# Patient Record
Sex: Male | Born: 1935 | Race: Black or African American | Hispanic: No | Smoking: Former smoker
Health system: Southern US, Community
[De-identification: ages and names within clinical notes are randomized; demographics above are authoritative.]

## PROBLEM LIST (undated history)

## (undated) DIAGNOSIS — K5792 Diverticulitis of intestine, part unspecified, without perforation or abscess without bleeding: Secondary | ICD-10-CM

## (undated) DIAGNOSIS — R011 Cardiac murmur, unspecified: Secondary | ICD-10-CM

## (undated) DIAGNOSIS — Z972 Presence of dental prosthetic device (complete) (partial): Secondary | ICD-10-CM

## (undated) DIAGNOSIS — I509 Heart failure, unspecified: Secondary | ICD-10-CM

## (undated) DIAGNOSIS — I10 Essential (primary) hypertension: Secondary | ICD-10-CM

## (undated) DIAGNOSIS — E7800 Pure hypercholesterolemia, unspecified: Secondary | ICD-10-CM

## (undated) DIAGNOSIS — K219 Gastro-esophageal reflux disease without esophagitis: Secondary | ICD-10-CM

## (undated) DIAGNOSIS — N200 Calculus of kidney: Secondary | ICD-10-CM

## (undated) DIAGNOSIS — E119 Type 2 diabetes mellitus without complications: Secondary | ICD-10-CM

## (undated) HISTORY — PX: HERNIA REPAIR: SHX51

## (undated) HISTORY — DX: Heart failure, unspecified: I50.9

## (undated) HISTORY — PX: KIDNEY STONE SURGERY: SHX686

## (undated) HISTORY — DX: Type 2 diabetes mellitus without complications: E11.9

## (undated) HISTORY — DX: Calculus of kidney: N20.0

## (undated) HISTORY — PX: FINGER AMPUTATION: SHX636

## (undated) HISTORY — DX: Gastro-esophageal reflux disease without esophagitis: K21.9

## (undated) HISTORY — DX: Essential (primary) hypertension: I10

## (undated) HISTORY — DX: Pure hypercholesterolemia, unspecified: E78.00

## (undated) HISTORY — PX: HEMORRHOID BANDING: SHX5850

## (undated) HISTORY — PX: ESOPHAGUS SURGERY: SHX626

---

## 2005-01-25 ENCOUNTER — Ambulatory Visit: Payer: Self-pay | Admitting: Family Medicine

## 2006-11-22 DIAGNOSIS — I639 Cerebral infarction, unspecified: Secondary | ICD-10-CM

## 2006-11-22 HISTORY — DX: Cerebral infarction, unspecified: I63.9

## 2009-12-17 ENCOUNTER — Inpatient Hospital Stay: Payer: Self-pay | Admitting: Internal Medicine

## 2011-03-02 ENCOUNTER — Ambulatory Visit: Payer: Self-pay | Admitting: Urology

## 2011-03-09 ENCOUNTER — Ambulatory Visit: Payer: Self-pay | Admitting: Urology

## 2011-03-11 ENCOUNTER — Ambulatory Visit: Payer: Self-pay | Admitting: Urology

## 2011-03-18 ENCOUNTER — Ambulatory Visit: Payer: Self-pay | Admitting: Urology

## 2016-01-17 DIAGNOSIS — K5790 Diverticulosis of intestine, part unspecified, without perforation or abscess without bleeding: Secondary | ICD-10-CM | POA: Insufficient documentation

## 2016-01-17 DIAGNOSIS — K922 Gastrointestinal hemorrhage, unspecified: Secondary | ICD-10-CM | POA: Insufficient documentation

## 2016-01-21 DIAGNOSIS — K8689 Other specified diseases of pancreas: Secondary | ICD-10-CM | POA: Insufficient documentation

## 2018-01-20 ENCOUNTER — Encounter: Payer: Self-pay | Admitting: Gastroenterology

## 2019-07-02 NOTE — Progress Notes (Signed)
[facility]  [address], [address] [place], [place] [address] Phone: [phone]  Fax: [phone]   Clinic Day:  07/04/2019  Referring physician: Gwynne Edinger, MD  Chief Complaint: Ryan Serrano is a 85 y.o. male with anemia who is referred in consultation by Dr. Laurey Arrow for assessment and management.   HPI: The patient was diagnosed with hemolytic anemia by his PCP about 9 months ago. He has a history of diabetes, hyperlipidemia, and hypertension.   He notes that about 3 years ago, he was told he was anemic after having developing chills. He was admitted to [facility].  Colonoscopy showed diverticulitis with a bleed that was cauterized (no records available).  He denies receiving a blood transfusion. He was treated with oral iron for 1 year with improvement.  He subsequently had an EGD and colonoscopy in [place].  Per patient, there were polyps and no evidence of ulcer or gastritis. He has not undergone a capsule study.   Hemoglobin has been followed: 13.8 on 01/22/2011, 12.4 on 10/29/2011, 13.8 on 09/11/2012, 11.7 on 01/17/2016, 10.7 on 01/18/2016, 10.1 on 01/21/2016, 13.9 on 01/16/2017, 12.6 on 12/12/2017, 12.5 on 10/18/2018, 11.2 on 10/30/2018.  Guaiac cards were negative on 01/06/2018.  Urinalysis was negative for blood on 12/12/2017.   Available CBCs: 10/30/2018: hematocrit 34.9, hemoglobin 11.2, MCV 100, platelets 181,000, WBC 7,600 (ANC 5,100).  B12 was 591.  MMA was 17.8 (0.3-2.8).  LDH was 154.  Coombs was negative. 01/25/2019: hematocrit 36.5, hemoglobin 11.5, MCV 88, platelets 137,000, WBC 7,000 (ANC 4,600).  B12 was 636.   Symptomatically, he feels about the same as he did in 01/2019. He denies any fevers, sweats, or weight loss. He denies any headaches or visual changes. He denies any cough, shortness of breath or chest pain. He denies any abdominal pain, black stools, bloody stools, blood in his urine, or dysuria. He notes diabetic neuropathy. He  has chronic joint pain, for which he takes Tylenol.  He has occasional cramps. He notes dark spots on his arms.   His diet includes a lot of fried food. He eats meat about every day. He eats green, leafy vegetables. He denies any ice pica. He denies any restless legs.   He is currently on oral B-12 supplements. He denies any new medications or herbal products. He has a family history of prostate cancer in his father. He denies any family history of anemia. He is a poor historian and has limited knowledge of his family history.    Past Medical History:  Diagnosis Date  . Diabetes mellitus without complication ([place])   . GERD (gastroesophageal reflux disease)   . High cholesterol   . Hypertension   . Kidney stones     Past Surgical History:  Procedure Laterality Date  . ESOPHAGUS SURGERY    . FINGER AMPUTATION Left   . HEMORRHOID BANDING    . HERNIA REPAIR    . KIDNEY STONE SURGERY      Family History  Problem Relation Age of Onset  . Hypertension Mother   . Cancer Father   . Diabetes Father   . Hypertension Father     Social History:  reports that he quit smoking about 40 years ago. His smoking use included cigarettes. He quit after 16.00 years of use. His smokeless tobacco use includes chew. He reports that he does not drink alcohol or use drugs. He denies any tobacco or alcohol use. He denies any known exposure to radiation or toxins. He  currently works farming soybeans, corn, and wheat. He lives in [place]. He has been married for 55 years and has 4 children. The patient is alone in the clinic today with his daughter Ryan Serrano over the phone [phone]).   Allergies:  Allergies  Allergen Reactions  . Hydrochlorothiazide     Other reaction(s): hyponatremia-stopped by cards  . Penicillins Anaphylaxis and Other (See Comments)  . Atorvastatin Other (See Comments)    Other reaction(s): myalgias 12-06   . Bee Venom Other (See Comments)  . Hydrocodone Other (See Comments)  .  Hydrocodone-Acetaminophen Other (See Comments)    Nausea/vomotting Nausea/vomotting     Current Medications: Current Outpatient Medications  Medication Sig Dispense Refill  . acetaminophen (ACETAMINOPHEN 8 HOUR) 650 MG CR tablet Take 1,300 mg by mouth 2 (two) times daily.    Marland Kitchen amLODipine (NORVASC) 5 MG tablet Take 5 mg by mouth daily.     Marland Kitchen aspirin EC 81 MG tablet Take 81 mg by mouth daily.     . diclofenac sodium (VOLTAREN) 1 % GEL Apply 2 g topically as needed.     . fluticasone (FLONASE) 50 MCG/ACT nasal spray 1 spray by Each Nare route daily.    Marland Kitchen gabapentin (NEURONTIN) 300 MG capsule 600 mg Three (3) times a day.    . lisinopril (ZESTRIL) 40 MG tablet Take 40 mg by mouth daily.     . metFORMIN (GLUCOPHAGE-XR) 500 MG 24 hr tablet Take 500 mg by mouth 2 (two) times daily.     . pantoprazole (PROTONIX) 20 MG tablet Take 20 mg by mouth daily.    . simvastatin (ZOCOR) 20 MG tablet Take 20 mg by mouth daily at 6 PM.     . tamsulosin (FLOMAX) 0.4 MG CAPS capsule Take 0.4 mg by mouth daily.     . vitamin B-12 (CYANOCOBALAMIN) 1000 MCG tablet Take 1,000 mcg by mouth daily.     No current facility-administered medications for this visit.     Review of Systems  Constitutional: Negative.  Negative for chills, diaphoresis, fever, malaise/fatigue and weight loss.  HENT: Negative.  Negative for congestion, hearing loss, sinus pain and sore throat.   Eyes: Negative.  Negative for blurred vision.  Respiratory: Negative.  Negative for cough, shortness of breath and wheezing.   Cardiovascular: Negative.  Negative for chest pain, palpitations, orthopnea, leg swelling and PND.  Gastrointestinal: Negative.  Negative for abdominal pain, blood in stool, constipation, diarrhea, melena, nausea and vomiting.  Genitourinary: Negative.  Negative for dysuria, frequency, hematuria and urgency.  Musculoskeletal: Positive for joint pain (chronic) and myalgias (occasionally). Negative for back pain.  Skin:  Negative for rash.       Dark spots on arms.  Neurological: Positive for sensory change (diabetic neuropathy). Negative for dizziness, tingling, weakness and headaches.  Endo/Heme/Allergies: Negative.  Does not bruise/bleed easily.  Psychiatric/Behavioral: Negative.  Negative for depression, memory loss and substance abuse. The patient is not nervous/anxious and does not have insomnia.   All other systems reviewed and are negative.  Performance status (ECOG): 0  Vitals Blood pressure 137/79, pulse 66, temperature 97.7 F (36.5 C), temperature source Oral, resp. rate 16, weight 159 lb 13.3 oz (72.5 kg), SpO2 96 %.   Physical Exam  Constitutional: He is oriented to person, place, and time. He appears well-developed and well-nourished. No distress.  HENT:  Head: Normocephalic and atraumatic.  Mouth/Throat: Oropharynx is clear and moist. No oropharyngeal exudate.  Wearing a black cap and mask.  Short gray hair.  Eyes: Pupils are equal, round, and reactive to light. Conjunctivae and EOM are normal. No scleral icterus.  Brown eyes.  Neck: Normal range of motion. Neck supple.  Cardiovascular: Normal rate, regular rhythm and normal heart sounds.  No murmur heard. Pulmonary/Chest: Effort normal and breath sounds normal. No respiratory distress. He has no wheezes.  Abdominal: Soft. Bowel sounds are normal. He exhibits no distension. There is no splenomegaly or hepatomegaly. There is no abdominal tenderness.  Musculoskeletal: Normal range of motion.        General: No edema.  Lymphadenopathy:    He has no cervical adenopathy.    He has no axillary adenopathy.       Right: No supraclavicular adenopathy present.       Left: No supraclavicular adenopathy present.  Neurological: He is alert and oriented to person, place, and time.  Skin: Skin is warm and dry. He is not diaphoretic. No erythema.  Psychiatric: He has a normal mood and affect. His behavior is normal. Judgment and thought content  normal.  Nursing note and vitals reviewed.   No visits with results within 3 Day(s) from this visit.  Latest known visit with results is:  No results found for any previous visit.    Assessment:  Ryan Serrano is a 85 y.o. male with a normocytic anemia.  Hemoglobin has ranged from 11.2 - 11.5.  He has a history of a diverticular bleed 3 years ago necessitating admission.  He had an EGD and colonoscopy in [place] about 2 years ago (no report available).  Per patient, there were polyps and no evidence of ulcer or gastritis. He has not undergone a capsule study.  Guaiac cards were negative on 01/06/2018.  Urinalysis was negative for blood on 12/12/2017.   [facility] labs from 09/26/2018 - 01/25/2019 revealed following normal studies:  B12, LDH, Coombs.  MMA was 17.8 (0.3 - 2.8).    He is on oral B-12.  Symptomatically, he denies any abdominal pain, melena, hematochezia, hematuria or dark urine.  Diet is good.  He denies any ice pica or restless legs.   Plan: 1. Labs today: CBC with diff, CMP, ferritin, iron studies, retic count, LDH, haptoglobin, Coombs, G6PD assay, B12, folate, TSH, free T4. 2. Normocytic anemia  Discuss history of lower GI bleed and subsequent iron deficiency.  Discuss concern for hemolytic anemia and evaluation.  Discuss work-up. 3. RTC in 1 week for MD assessment, review of work-up and discussion regarding direction of therapy.  I discussed the assessment and treatment plan with the patient.  The patient was provided an opportunity to ask questions and all were answered.  The patient agreed with the plan and demonstrated an understanding of the instructions.  The patient was advised to call back if the symptoms worsen or if the condition fails to improve as anticipated.  I provided 25 minutes of face-to-face time during this this encounter and > 50% was spent counseling as documented under my assessment and plan.     C. Mike Gip, MD, PhD    07/04/2019, 10:51  AM  I, Molly Dorshimer, am acting as Education administrator for [employer]. Mike Gip, MD, PhD.  I,  C. Mike Gip, MD, have reviewed the above documentation for accuracy and completeness, and I agree with the above.

## 2019-07-04 ENCOUNTER — Inpatient Hospital Stay: Payer: Medicare HMO | Attending: Hematology and Oncology | Admitting: Hematology and Oncology

## 2019-07-04 ENCOUNTER — Inpatient Hospital Stay: Payer: Medicare HMO

## 2019-07-04 ENCOUNTER — Encounter: Payer: Self-pay | Admitting: Hematology and Oncology

## 2019-07-04 ENCOUNTER — Other Ambulatory Visit: Payer: Self-pay

## 2019-07-04 VITALS — BP 137/79 | HR 66 | Temp 97.7°F | Resp 16 | Wt 159.8 lb

## 2019-07-04 DIAGNOSIS — I10 Essential (primary) hypertension: Secondary | ICD-10-CM | POA: Insufficient documentation

## 2019-07-04 DIAGNOSIS — K5790 Diverticulosis of intestine, part unspecified, without perforation or abscess without bleeding: Secondary | ICD-10-CM | POA: Diagnosis not present

## 2019-07-04 DIAGNOSIS — D649 Anemia, unspecified: Secondary | ICD-10-CM | POA: Diagnosis not present

## 2019-07-04 DIAGNOSIS — Z79899 Other long term (current) drug therapy: Secondary | ICD-10-CM | POA: Insufficient documentation

## 2019-07-04 DIAGNOSIS — E119 Type 2 diabetes mellitus without complications: Secondary | ICD-10-CM | POA: Insufficient documentation

## 2019-07-04 DIAGNOSIS — Z8042 Family history of malignant neoplasm of prostate: Secondary | ICD-10-CM | POA: Insufficient documentation

## 2019-07-04 DIAGNOSIS — K219 Gastro-esophageal reflux disease without esophagitis: Secondary | ICD-10-CM | POA: Diagnosis not present

## 2019-07-04 DIAGNOSIS — E7800 Pure hypercholesterolemia, unspecified: Secondary | ICD-10-CM | POA: Diagnosis not present

## 2019-07-04 LAB — CBC WITH DIFFERENTIAL/PLATELET
Abs Immature Granulocytes: 0.02 10*3/uL (ref 0.00–0.07)
Basophils Absolute: 0.1 10*3/uL (ref 0.0–0.1)
Basophils Relative: 1 %
Eosinophils Absolute: 0.1 10*3/uL (ref 0.0–0.5)
Eosinophils Relative: 1 %
HCT: 39.1 % (ref 39.0–52.0)
Hemoglobin: 12.2 g/dL — ABNORMAL LOW (ref 13.0–17.0)
Immature Granulocytes: 0 %
Lymphocytes Relative: 18 %
Lymphs Abs: 1.4 10*3/uL (ref 0.7–4.0)
MCH: 28 pg (ref 26.0–34.0)
MCHC: 31.2 g/dL (ref 30.0–36.0)
MCV: 89.7 fL (ref 80.0–100.0)
Monocytes Absolute: 0.5 10*3/uL (ref 0.1–1.0)
Monocytes Relative: 7 %
Neutro Abs: 5.4 10*3/uL (ref 1.7–7.7)
Neutrophils Relative %: 73 %
Platelets: 137 10*3/uL — ABNORMAL LOW (ref 150–400)
RBC: 4.36 MIL/uL (ref 4.22–5.81)
RDW: 15.6 % — ABNORMAL HIGH (ref 11.5–15.5)
WBC: 7.4 10*3/uL (ref 4.0–10.5)
nRBC: 0 % (ref 0.0–0.2)

## 2019-07-04 LAB — COMPREHENSIVE METABOLIC PANEL
ALT: 12 U/L (ref 0–44)
AST: 15 U/L (ref 15–41)
Albumin: 3.9 g/dL (ref 3.5–5.0)
Alkaline Phosphatase: 64 U/L (ref 38–126)
Anion gap: 9 (ref 5–15)
BUN: 12 mg/dL (ref 8–23)
CO2: 26 mmol/L (ref 22–32)
Calcium: 9.1 mg/dL (ref 8.9–10.3)
Chloride: 105 mmol/L (ref 98–111)
Creatinine, Ser: 1.04 mg/dL (ref 0.61–1.24)
GFR calc Af Amer: >60 mL/min (ref >60)
GFR calc non Af Amer: >60 mL/min (ref >60)
Glucose, Bld: 190 mg/dL — ABNORMAL HIGH (ref 70–99)
Potassium: 4.3 mmol/L (ref 3.5–5.1)
Sodium: 140 mmol/L (ref 135–145)
Total Bilirubin: 0.6 mg/dL (ref 0.3–1.2)
Total Protein: 7.3 g/dL (ref 6.5–8.1)

## 2019-07-04 LAB — IRON AND TIBC
Iron: 60 ug/dL (ref 45–182)
Saturation Ratios: 15 % — ABNORMAL LOW (ref 17.9–39.5)
TIBC: 395 ug/dL (ref 250–450)
UIBC: 335 ug/dL

## 2019-07-04 LAB — FERRITIN: Ferritin: 12 ng/mL — ABNORMAL LOW (ref 24–336)

## 2019-07-04 LAB — FOLATE: Folate: 14.5 ng/mL (ref >5.9)

## 2019-07-04 LAB — LACTATE DEHYDROGENASE: LDH: 142 U/L (ref 98–192)

## 2019-07-04 LAB — RETICULOCYTES
Immature Retic Fract: 12 % (ref 2.3–15.9)
RBC.: 4.44 MIL/uL (ref 4.22–5.81)
Retic Count, Absolute: 52.8 10*3/uL (ref 19.0–186.0)
Retic Ct Pct: 1.2 % (ref 0.4–3.1)

## 2019-07-04 LAB — TSH: TSH: 0.88 u[IU]/mL (ref 0.350–4.500)

## 2019-07-04 NOTE — Progress Notes (Signed)
Pt here as new patient referral from Dr. Si Raider. Pt denies any concerns.

## 2019-07-05 LAB — HAPTOGLOBIN: Haptoglobin: 194 mg/dL (ref 38–329)

## 2019-07-06 LAB — MULTIPLE MYELOMA PANEL, SERUM
Albumin SerPl Elph-Mcnc: 3.8 g/dL (ref 2.9–4.4)
Albumin/Glob SerPl: 1.4 (ref 0.7–1.7)
Alpha 1: 0.2 g/dL (ref 0.0–0.4)
Alpha2 Glob SerPl Elph-Mcnc: 0.7 g/dL (ref 0.4–1.0)
B-Globulin SerPl Elph-Mcnc: 1.3 g/dL (ref 0.7–1.3)
Gamma Glob SerPl Elph-Mcnc: 0.7 g/dL (ref 0.4–1.8)
Globulin, Total: 2.9 g/dL (ref 2.2–3.9)
IgA: 527 mg/dL — ABNORMAL HIGH (ref 61–437)
IgG (Immunoglobin G), Serum: 878 mg/dL (ref 603–1613)
IgM (Immunoglobulin M), Srm: 44 mg/dL (ref 15–143)
M Protein SerPl Elph-Mcnc: 0.4 g/dL — ABNORMAL HIGH
Total Protein ELP: 6.7 g/dL (ref 6.0–8.5)

## 2019-07-11 ENCOUNTER — Inpatient Hospital Stay: Payer: Medicare HMO | Admitting: Oncology

## 2019-07-11 NOTE — Progress Notes (Deleted)
[facility]  [address], [address] [place],  [address] Phone: [phone]  Fax: [phone]   Clinic Day:  07/11/2019  Referring physician: Weber Cooks, MD  Chief Complaint: Ryan Serrano is a 85 y.o. male with anemia who is referred in consultation by Dr. Laurey Arrow for assessment and management.   HPI: The patient was diagnosed with hemolytic anemia by his PCP about 9 months ago. He has a history of diabetes, hyperlipidemia, and hypertension.   He notes that about 3 years ago, he was told he was anemic after having developing chills. He was admitted to [facility].  Colonoscopy showed diverticulitis with a bleed that was cauterized (no records available).  He denies receiving a blood transfusion. He was treated with oral iron for 1 year with improvement.  He subsequently had an EGD and colonoscopy in [place].  Per patient, there were polyps and no evidence of ulcer or gastritis. He has not undergone a capsule study.   Hemoglobin has been followed: 13.8 on 01/22/2011, 12.4 on 10/29/2011, 13.8 on 09/11/2012, 11.7 on 01/17/2016, 10.7 on 01/18/2016, 10.1 on 01/21/2016, 13.9 on 01/16/2017, 12.6 on 12/12/2017, 12.5 on 10/18/2018, 11.2 on 10/30/2018.  Guaiac cards were negative on 01/06/2018.  Urinalysis was negative for blood on 12/12/2017.   Available CBCs: 10/30/2018: hematocrit 34.9, hemoglobin 11.2, MCV 100, platelets 181,000, WBC 7,600 (ANC 5,100).  B12 was 591.  MMA was 17.8 (0.3-2.8).  LDH was 154.  Coombs was negative. 01/25/2019: hematocrit 36.5, hemoglobin 11.5, MCV 88, platelets 137,000, WBC 7,000 (ANC 4,600).  B12 was 636.   Symptomatically, he feels about the same as he did in 01/2019. He denies any fevers, sweats, or weight loss. He denies any headaches or visual changes. He denies any cough, shortness of breath or chest pain. He denies any abdominal pain, black stools, bloody stools, blood in his urine, or dysuria. He notes diabetic neuropathy. He has  chronic joint pain, for which he takes Tylenol.  He has occasional cramps. He notes dark spots on his arms.   His diet includes a lot of fried food. He eats meat about every day. He eats green, leafy vegetables. He denies any ice pica. He denies any restless legs.   He is currently on oral B-12 supplements. He denies any new medications or herbal products. He has a family history of prostate cancer in his father. He denies any family history of anemia. He is a poor historian and has limited knowledge of his family history.    Past Medical History:  Diagnosis Date  . Diabetes mellitus without complication ([place])   . GERD (gastroesophageal reflux disease)   . High cholesterol   . Hypertension   . Kidney stones     Past Surgical History:  Procedure Laterality Date  . ESOPHAGUS SURGERY    . FINGER AMPUTATION Left   . HEMORRHOID BANDING    . HERNIA REPAIR    . KIDNEY STONE SURGERY      Family History  Problem Relation Age of Onset  . Hypertension Mother   . Cancer Father   . Diabetes Father   . Hypertension Father     Social History:  reports that he quit smoking about 40 years ago. His smoking use included cigarettes. He quit after 16.00 years of use. His smokeless tobacco use includes chew. He reports that he does not drink alcohol or use drugs. He denies any tobacco or alcohol use. He denies any known exposure to radiation or toxins. 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 He has been married for 55 years and has 4 children. The patient is alone in the clinic today with his daughter [name] over the phone [phone]).   Allergies:  Allergies  Allergen Reactions  . Hydrochlorothiazide     Other reaction(s): hyponatremia-stopped by cards  . Penicillins Anaphylaxis and Other (See Comments)  . Atorvastatin Other (See Comments)    Other reaction(s): myalgias 12-06   . Bee Venom Other (See Comments)  . Hydrocodone Other (See Comments)  .  Hydrocodone-Acetaminophen Other (See Comments)    Nausea/vomotting Nausea/vomotting     Current Medications: Current Outpatient Medications  Medication Sig Dispense Refill  . acetaminophen (ACETAMINOPHEN 8 HOUR) 650 MG CR tablet Take 1,300 mg by mouth 2 (two) times daily.    Marland Kitchen amLODipine (NORVASC) 5 MG tablet Take 5 mg by mouth daily.     Marland Kitchen aspirin EC 81 MG tablet Take 81 mg by mouth daily.     . diclofenac sodium (VOLTAREN) 1 % GEL Apply 2 g topically as needed.     . fluticasone (FLONASE) 50 MCG/ACT nasal spray 1 spray by Each Nare route daily.    Marland Kitchen gabapentin (NEURONTIN) 300 MG capsule 600 mg Three (3) times a day.    . lisinopril (ZESTRIL) 40 MG tablet Take 40 mg by mouth daily.     . metFORMIN (GLUCOPHAGE-XR) 500 MG 24 hr tablet Take 500 mg by mouth 2 (two) times daily.     . pantoprazole (PROTONIX) 20 MG tablet Take 20 mg by mouth daily.    . simvastatin (ZOCOR) 20 MG tablet Take 20 mg by mouth daily at 6 PM.     . tamsulosin (FLOMAX) 0.4 MG CAPS capsule Take 0.4 mg by mouth daily.     . vitamin B-12 (CYANOCOBALAMIN) 1000 MCG tablet Take 1,000 mcg by mouth daily.     No current facility-administered medications for this visit.     Review of Systems  Constitutional: Negative.  Negative for chills, diaphoresis, fever, malaise/fatigue and weight loss.  HENT: Negative.  Negative for congestion, hearing loss, sinus pain and sore throat.   Eyes: Negative.  Negative for blurred vision.  Respiratory: Negative.  Negative for cough, shortness of breath and wheezing.   Cardiovascular: Negative.  Negative for chest pain, palpitations, orthopnea, leg swelling and PND.  Gastrointestinal: Negative.  Negative for abdominal pain, blood in stool, constipation, diarrhea, melena, nausea and vomiting.  Genitourinary: Negative.  Negative for dysuria, frequency, hematuria and urgency.  Musculoskeletal: Positive for joint pain (chronic) and myalgias (occasionally). Negative for back pain.  Skin:  Negative for rash.       Dark spots on arms.  Neurological: Positive for sensory change (diabetic neuropathy). Negative for dizziness, tingling, weakness and headaches.  Endo/Heme/Allergies: Negative.  Does not bruise/bleed easily.  Psychiatric/Behavioral: Negative.  Negative for depression, memory loss and substance abuse. The patient is not nervous/anxious and does not have insomnia.   All other systems reviewed and are negative.  Performance status (ECOG): 0  Vitals There were no vitals taken for this visit.   Physical Exam  Constitutional: He is oriented to person, place, and time. He appears well-developed and well-nourished. No distress.  HENT:  Head: Normocephalic and atraumatic.  Mouth/Throat: Oropharynx is clear and moist. No oropharyngeal exudate.  Wearing a black cap and mask.  Short gray hair.  Eyes: Pupils are equal, round, and reactive to light. Conjunctivae and EOM are normal. No scleral icterus.  [place]  eyes.  Neck: Normal range of motion. Neck supple.  Cardiovascular: Normal rate, regular rhythm and normal heart sounds.  No murmur heard. Pulmonary/Chest: Effort normal and breath sounds normal. No respiratory distress. He has no wheezes.  Abdominal: Soft. Bowel sounds are normal. He exhibits no distension. There is no splenomegaly or hepatomegaly. There is no abdominal tenderness.  Musculoskeletal: Normal range of motion.        General: No edema.  Lymphadenopathy:    He has no cervical adenopathy.    He has no axillary adenopathy.       Right: No supraclavicular adenopathy present.       Left: No supraclavicular adenopathy present.  Neurological: He is alert and oriented to person, place, and time.  Skin: Skin is warm and dry. He is not diaphoretic. No erythema.  Psychiatric: He has a normal mood and affect. His behavior is normal. Judgment and thought content normal.  Nursing note and vitals reviewed.   No visits with results within 3 Day(s) from this visit.   Latest known visit with results is:  Office Visit on 07/04/2019  Component Date Value Ref Range Status  . IgG (Immunoglobin G), Serum 07/04/2019 878  603 - 1,613 mg/dL Final  . IgA 07/04/2019 527* 61 - 437 mg/dL Final  . IgM (Immunoglobulin M), Srm 07/04/2019 44  15 - 143 mg/dL Final  . Total Protein ELP 07/04/2019 6.7  6.0 - 8.5 g/dL Corrected  . Albumin SerPl Elph-Mcnc 07/04/2019 3.8  2.9 - 4.4 g/dL Corrected  . Alpha 1 07/04/2019 0.2  0.0 - 0.4 g/dL Corrected  . Alpha2 Glob SerPl Elph-Mcnc 07/04/2019 0.7  0.4 - 1.0 g/dL Corrected  . B-Globulin SerPl Elph-Mcnc 07/04/2019 1.3  0.7 - 1.3 g/dL Corrected  . Gamma Glob SerPl Elph-Mcnc 07/04/2019 0.7  0.4 - 1.8 g/dL Corrected  . M Protein SerPl Elph-Mcnc 07/04/2019 0.4* Not Observed g/dL Corrected  . Globulin, Total 07/04/2019 2.9  2.2 - 3.9 g/dL Corrected  . Albumin/Glob SerPl 07/04/2019 1.4  0.7 - 1.7 Corrected  . 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   . Please Note 07/04/2019 Comment   Corrected   Comment: (NOTE) Protein electrophoresis scan will follow via computer, mail, or courier delivery. Performed At: [facility] [place], [place] [id number] Rush Farmer MD IN:[id number]   . TSH 07/04/2019 0.880  0.350 - 4.500 uIU/mL Final   Comment: Performed by a 3rd Generation assay with a functional sensitivity of <=0.01 uIU/mL. Performed at [facility], [address], [place], [place] [address]   . Folate 07/04/2019 14.5  >5.9 ng/mL Final   Performed at [facility], [address] [place], [place] [address]  . LDH 07/04/2019 142  98 - 192 U/L Final   Performed at [facility], [address], [place], [place] [address]  . Retic Ct Pct 07/04/2019 1.2  0.4 - 3.1 % Final  . RBC. 07/04/2019 4.44  4.22 - 5.81 MIL/uL Final  . Retic Count, Absolute 07/04/2019 52.8  19.0 - 186.0 K/uL  Final  . Immature Retic Fract 07/04/2019 12.0  2.3 - 15.9 % Final   Performed at [facility], [address] [place], [place] [address]  . Iron 07/04/2019 60  45 - 182 ug/dL Final  . TIBC 07/04/2019 395  250 - 450 ug/dL Final  . Saturation Ratios 07/04/2019 15* 17.9 - 39.5 % Final  .  UIBC 07/04/2019 335  ug/dL Final   Performed at [facility], [address] [place], [place] [address]  . Ferritin 07/04/2019 12* 24 - 336 ng/mL Final   Performed at [facility], [address] [place], [place] [address]  . Sodium 07/04/2019 140  135 - 145 mmol/L Final  . Potassium 07/04/2019 4.3  3.5 - 5.1 mmol/L Final  . Chloride 07/04/2019 105  98 - 111 mmol/L Final  . CO2 07/04/2019 26  22 - 32 mmol/L Final  . Glucose, Bld 07/04/2019 190* 70 - 99 mg/dL Final  . BUN 07/04/2019 12  8 - 23 mg/dL Final  . Creatinine, Ser 07/04/2019 1.04  0.61 - 1.24 mg/dL Final  . Calcium 07/04/2019 9.1  8.9 - 10.3 mg/dL Final  . Total Protein 07/04/2019 7.3  6.5 - 8.1 g/dL Final  . Albumin 07/04/2019 3.9  3.5 - 5.0 g/dL Final  . AST 07/04/2019 15  15 - 41 U/L Final  . ALT 07/04/2019 12  0 - 44 U/L Final  . Alkaline Phosphatase 07/04/2019 64  38 - 126 U/L Final  . Total Bilirubin 07/04/2019 0.6  0.3 - 1.2 mg/dL Final  . GFR calc non Af Amer 07/04/2019 >60  >60 mL/min Final  . GFR calc Af Amer 07/04/2019 >60  >60 mL/min Final  . Anion gap 07/04/2019 9  5 - 15 Final   Performed at [facility], [address], [place], [place] [address]  . WBC 07/04/2019 7.4  4.0 - 10.5 K/uL Final  . RBC 07/04/2019 4.36  4.22 - 5.81 MIL/uL Final  . Hemoglobin 07/04/2019 12.2* 13.0 - 17.0 g/dL Final  . HCT 07/04/2019 39.1  39.0 - 52.0 % Final  . MCV 07/04/2019 89.7  80.0 - 100.0 fL Final  . MCH 07/04/2019 28.0  26.0 - 34.0 pg Final  . MCHC 07/04/2019 31.2  30.0 - 36.0 g/dL Final  . RDW 07/04/2019 15.6* 11.5 - 15.5 % Final  . Platelets 07/04/2019 137* 150 - 400 K/uL Final  . nRBC  07/04/2019 0.0  0.0 - 0.2 % Final  . Neutrophils Relative % 07/04/2019 73  % Final  . Neutro Abs 07/04/2019 5.4  1.7 - 7.7 K/uL Final  . Lymphocytes Relative 07/04/2019 18  % Final  . Lymphs Abs 07/04/2019 1.4  0.7 - 4.0 K/uL Final  . Monocytes Relative 07/04/2019 7  % Final  . Monocytes Absolute 07/04/2019 0.5  0.1 - 1.0 K/uL Final  . Eosinophils Relative 07/04/2019 1  % Final  . Eosinophils Absolute 07/04/2019 0.1  0.0 - 0.5 K/uL Final  . Basophils Relative 07/04/2019 1  % Final  . Basophils Absolute 07/04/2019 0.1  0.0 - 0.1 K/uL Final  . Immature Granulocytes 07/04/2019 0  % Final  . Abs Immature Granulocytes 07/04/2019 0.02  0.00 - 0.07 K/uL Final   Performed at [facility], [address], [place], [place] [address]  . Haptoglobin 07/04/2019 194  38 - 329 mg/dL Final   Comment: (NOTE) Performed At: [facility] [place], [place] [id number] Rush Farmer MD PI:[id number]     Assessment:  Eivin Mascio is a 85 y.o. male with a normocytic anemia.  Hemoglobin has ranged from 11.2 - 11.5.  He has a history of a diverticular bleed 3 years ago necessitating admission.  He had an EGD and colonoscopy in [place] about 2 years ago (no report available).  Per patient, there were polyps and no evidence of ulcer  or gastritis. He has not undergone a capsule study.  Guaiac cards were negative on 01/06/2018.  Urinalysis was negative for blood on 12/12/2017.   [place] labs from 09/26/2018 - 01/25/2019 revealed following normal studies:  B12, LDH, Coombs.  MMA was 17.8 (0.3 - 2.8).    He is on oral B-12.  Symptomatically, he denies any abdominal pain, melena, hematochezia, hematuria or dark urine.  Diet is good.  He denies any ice pica or restless legs.   Plan: 1. Labs review 2. Normocytic anemia 3. Iron deficiency anemia: Ferritin 12, Saturation ratio 15%.  Hemoglobin 12.2. Discuss initiating iron supplementation orally versus parenterally  RTC in 1  week for MD assessment, review of work-up and discussion regarding direction of therapy.  I discussed the assessment and treatment plan with the patient.  The patient was provided an opportunity to ask questions and all were answered.  The patient agreed with the plan and demonstrated an understanding of the instructions.  The patient was advised to call back if the symptoms worsen or if the condition fails to improve as anticipated.  I provided 25 minutes of face-to-face time during this this encounter and > 50% was spent counseling as documented under my assessment and plan.    Faythe Casa, NP 07/11/2019 9:55 AM

## 2019-07-24 ENCOUNTER — Encounter: Payer: Self-pay | Admitting: Obstetrics and Gynecology

## 2019-07-24 NOTE — Progress Notes (Signed)
[facility]  [address] [place], [place] [address] Phone: [phone]  Fax: [phone]   Clinic Day:  07/25/2019  Referring physician: Weber Cooks, MD  Chief Complaint: Ryan Serrano is a 85 y.o. male with normocytic anemia who is seen for review of work-up and discussion regarding direction of therapy.   HPI: The patient was last seen in the hematology clinic on 07/04/2019 for initial consultation of a normocytic anemia.  He denied any abdominal pain, melena, hematochezia, hematuria or dark urine.  Diet was good. He denied any ice pica or restless legs.   Work-up on 07/04/2019: hematocrit 39.1, hemoglobin 12.2, MCV 89.7, platelets 137,000, WBC 7,400 with a normal differential.  Creatinine was 1.04.  LFTs were normal.  Ferritin was 12 with an iron saturation 15% and a TIBC of 395.  Haptoglobin was 194. Retic was 1.2%. LDH was 142.  Folate was 14.5, TSH 0.880. SPEP revealed a 0.4 gm/dL IgA monoclonal protein with kappa light chain specificity.   IgG was 878, IgA 527, and IgM 44.   During the interim, he has felt fair sometimes and good other times.  He denies any bleeding.     Past Medical History:  Diagnosis Date   Diabetes mellitus without complication (HCC)    GERD (gastroesophageal reflux disease)    High cholesterol    Hypertension    Kidney stones     Past Surgical History:  Procedure Laterality Date   ESOPHAGUS SURGERY     FINGER AMPUTATION Left    HEMORRHOID BANDING     HERNIA REPAIR     KIDNEY STONE SURGERY      Family History  Problem Relation Age of Onset   Hypertension Mother    Cancer Father    Diabetes Father    Hypertension Father     Social History:  reports that he quit smoking about 40 years ago. His smoking use included cigarettes. He quit after 16.00 years of use. His smokeless tobacco use includes chew. He reports that he does not drink alcohol or use drugs. He denies any tobacco or alcohol use. He  denies any known exposure to radiation or toxins. He currently works farming soybeans, corn, and wheat. He lives in [place]. He has been married for 55 years and has 4 children.  His daughter's name is [name] [phone]).  The patient is alone in the clinic today.  Allergies:  Allergies  Allergen Reactions   Hydrochlorothiazide     Other reaction(s): hyponatremia-stopped by cards   Penicillins Anaphylaxis and Other (See Comments)   Atorvastatin Other (See Comments)    Other reaction(s): myalgias 12-06    Bee Venom Other (See Comments)   Hydrocodone Other (See Comments)   Hydrocodone-Acetaminophen Other (See Comments)    Nausea/vomotting Nausea/vomotting     Current Medications: Current Outpatient Medications  Medication Sig Dispense Refill   acetaminophen (ACETAMINOPHEN 8 HOUR) 650 MG CR tablet Take 1,300 mg by mouth 2 (two) times daily.     amLODipine (NORVASC) 5 MG tablet Take 5 mg by mouth daily.      aspirin EC 81 MG tablet Take 81 mg by mouth daily.      diclofenac sodium (VOLTAREN) 1 % GEL Apply 2 g topically as needed.      fluticasone (FLONASE) 50 MCG/ACT nasal spray 1 spray by Each Nare route daily.     gabapentin (NEURONTIN) 300 MG capsule 600 mg Three (3) times a day.     lisinopril (ZESTRIL) 40 MG  tablet Take 40 mg by mouth daily.      metFORMIN (GLUCOPHAGE-XR) 500 MG 24 hr tablet Take 500 mg by mouth 2 (two) times daily.      pantoprazole (PROTONIX) 20 MG tablet Take 20 mg by mouth daily.     simvastatin (ZOCOR) 20 MG tablet Take 20 mg by mouth daily at 6 PM.      tamsulosin (FLOMAX) 0.4 MG CAPS capsule Take 0.4 mg by mouth daily.      vitamin B-12 (CYANOCOBALAMIN) 1000 MCG tablet Take 1,000 mcg by mouth daily.     No current facility-administered medications for this visit.     Review of Systems  Constitutional: Positive for weight loss (2 pounds). Negative for chills, diaphoresis, fever and malaise/fatigue.       Feels "fair some days and  good other days".  HENT: Negative.  Negative for congestion, ear pain, hearing loss, nosebleeds, sinus pain and sore throat.   Eyes: Negative.  Negative for blurred vision, double vision and pain.  Respiratory: Negative.  Negative for cough, sputum production, shortness of breath and wheezing.   Cardiovascular: Negative.  Negative for chest pain, palpitations, orthopnea, leg swelling and PND.  Gastrointestinal: Negative.  Negative for blood in stool, constipation, diarrhea, melena, nausea and vomiting.  Genitourinary: Negative.  Negative for dysuria, frequency, hematuria and urgency.  Musculoskeletal: Positive for joint pain (chronic). Negative for back pain and myalgias.  Skin: Negative.  Negative for itching and rash.  Neurological: Positive for sensory change (diabetic neuropathy). Negative for tingling, weakness and headaches.  Endo/Heme/Allergies: Negative.  Does not bruise/bleed easily.       Diabetes.  Psychiatric/Behavioral: Negative.  Negative for depression, memory loss and substance abuse. The patient is not nervous/anxious and does not have insomnia.   All other systems reviewed and are negative.  Performance status (ECOG):  0  Vitals Blood pressure 117/61, pulse 70, temperature 97.6 F (36.4 C), temperature source Tympanic, resp. rate 18, weight 157 lb 15.4 oz (71.6 kg), SpO2 98 %.   Physical Exam  Constitutional: He is oriented to person, place, and time. He appears well-developed and well-nourished. No distress.  HENT:  Head: Normocephalic and atraumatic.  Blue cap and mask.  Short gray hair.  Eyes: Conjunctivae and EOM are normal. No scleral icterus.  Brown eyes.  Musculoskeletal:     Comments: S/p left index finger partial amputation.  Neurological: He is alert and oriented to person, place, and time.  Skin: He is not diaphoretic. No pallor.  Psychiatric: He has a normal mood and affect. His behavior is normal. Judgment and thought content normal.  Nursing note and  vitals reviewed.   No visits with results within 3 Day(s) from this visit.  Latest known visit with results is:  Office Visit on 07/04/2019  Component Date Value Ref Range Status   IgG (Immunoglobin G), Serum 07/04/2019 878  603 - 1,613 mg/dL Final   IgA 07/04/2019 527* 61 - 437 mg/dL Final   IgM (Immunoglobulin M), Srm 07/04/2019 44  15 - 143 mg/dL Final   Total Protein ELP 07/04/2019 6.7  6.0 - 8.5 g/dL Corrected   Albumin SerPl Elph-Mcnc 07/04/2019 3.8  2.9 - 4.4 g/dL Corrected   Alpha 1 07/04/2019 0.2  0.0 - 0.4 g/dL Corrected   Alpha2 Glob SerPl Elph-Mcnc 07/04/2019 0.7  0.4 - 1.0 g/dL Corrected   B-Globulin SerPl Elph-Mcnc 07/04/2019 1.3  0.7 - 1.3 g/dL Corrected   Gamma Glob SerPl Elph-Mcnc 07/04/2019 0.7  0.4 - 1.8 g/dL  Corrected   M Protein SerPl Elph-Mcnc 07/04/2019 0.4* Not Observed g/dL Corrected   Globulin, Total 07/04/2019 2.9  2.2 - 3.9 g/dL Corrected   Albumin/Glob SerPl 07/04/2019 1.4  0.7 - 1.7 Corrected   IFE 1 07/04/2019 Comment   Corrected   Comment: (NOTE) Immunofixation shows IgA monoclonal protein with kappa light chain specificity.    Please Note 07/04/2019 Comment   Corrected   Comment: (NOTE) Protein electrophoresis scan will follow via computer, mail, or courier delivery. Performed At: [facility] [place], [place] [id number] Rush Farmer MD [id number]    TSH 07/04/2019 0.880  0.350 - 4.500 uIU/mL Final   Comment: Performed by a 3rd Generation assay with a functional sensitivity of <=0.01 uIU/mL. Performed at [facility], [address] [place], [place] [address]    Folate 07/04/2019 14.5  >5.9 ng/mL Final   Performed at [facility], [address] [place], [place] [address]   LDH 07/04/2019 142  98 - 192 U/L Final   Performed at [facility], [address] [place], [place] [address]   Retic Ct Pct 07/04/2019 1.2  0.4 - 3.1 % Final   RBC. 07/04/2019 4.44  4.22 -  5.81 MIL/uL Final   Retic Count, Absolute 07/04/2019 52.8  19.0 - 186.0 K/uL Final   Immature Retic Fract 07/04/2019 12.0  2.3 - 15.9 % Final   Performed at [facility], [address] [place], [place] [address]   Iron 07/04/2019 60  45 - 182 ug/dL Final   TIBC 07/04/2019 395  250 - 450 ug/dL Final   Saturation Ratios 07/04/2019 15* 17.9 - 39.5 % Final   UIBC 07/04/2019 335  ug/dL Final   Performed at [facility], [address] [place], [place] [address]   Ferritin 07/04/2019 12* 24 - 336 ng/mL Final   Performed at [facility], [address] [place], [place] [address]   Sodium 07/04/2019 140  135 - 145 mmol/L Final   Potassium 07/04/2019 4.3  3.5 - 5.1 mmol/L Final   Chloride 07/04/2019 105  98 - 111 mmol/L Final   CO2 07/04/2019 26  22 - 32 mmol/L Final   Glucose, Bld 07/04/2019 190* 70 - 99 mg/dL Final   BUN 07/04/2019 12  8 - 23 mg/dL Final   Creatinine, Ser 07/04/2019 1.04  0.61 - 1.24 mg/dL Final   Calcium 07/04/2019 9.1  8.9 - 10.3 mg/dL Final   Total Protein 07/04/2019 7.3  6.5 - 8.1 g/dL Final   Albumin 07/04/2019 3.9  3.5 - 5.0 g/dL Final   AST 07/04/2019 15  15 - 41 U/L Final   ALT 07/04/2019 12  0 - 44 U/L Final   Alkaline Phosphatase 07/04/2019 64  38 - 126 U/L Final   Total Bilirubin 07/04/2019 0.6  0.3 - 1.2 mg/dL Final   GFR calc non Af Amer 07/04/2019 >60  >60 mL/min Final   GFR calc Af Amer 07/04/2019 >60  >60 mL/min Final   Anion gap 07/04/2019 9  5 - 15 Final   Performed at [facility], [address], [place], [place] [address]   WBC 07/04/2019 7.4  4.0 - 10.5 K/uL Final   RBC 07/04/2019 4.36  4.22 - 5.81 MIL/uL Final   Hemoglobin 07/04/2019 12.2* 13.0 - 17.0 g/dL Final   HCT 07/04/2019 39.1  39.0 - 52.0 % Final   MCV 07/04/2019 89.7  80.0 - 100.0 fL Final   [facility] 07/04/2019  28.0  26.0 - 34.0 pg Final   MCHC 07/04/2019 31.2  30.0 - 36.0 g/dL Final   RDW 07/04/2019 15.6* 11.5 -  15.5 % Final   Platelets 07/04/2019 137* 150 - 400 K/uL Final   nRBC 07/04/2019 0.0  0.0 - 0.2 % Final   Neutrophils Relative % 07/04/2019 73  % Final   Neutro Abs 07/04/2019 5.4  1.7 - 7.7 K/uL Final   Lymphocytes Relative 07/04/2019 18  % Final   Lymphs Abs 07/04/2019 1.4  0.7 - 4.0 K/uL Final   Monocytes Relative 07/04/2019 7  % Final   Monocytes Absolute 07/04/2019 0.5  0.1 - 1.0 K/uL Final   Eosinophils Relative 07/04/2019 1  % Final   Eosinophils Absolute 07/04/2019 0.1  0.0 - 0.5 K/uL Final   Basophils Relative 07/04/2019 1  % Final   Basophils Absolute 07/04/2019 0.1  0.0 - 0.1 K/uL Final   Immature Granulocytes 07/04/2019 0  % Final   Abs Immature Granulocytes 07/04/2019 0.02  0.00 - 0.07 K/uL Final   Performed at [facility], [address], [place], [place] [address]   Haptoglobin 07/04/2019 194  38 - 329 mg/dL Final   Comment: (NOTE) Performed At: [facility] [place], [place] [id number] Rush Farmer MD [id number]     Assessment:  Ryan Serrano is a 84 y.o. male with iron deficiency and an IgA monoclonal gammopathy.  Hemoglobin has ranged from 11.2 - 11.5.  [facility] labs from 09/26/2018 - 01/25/2019 revealed following normal studies:  B12, LDH, [place].  MMA was 17.8 (0.3 - 2.8).    Work-up on 07/04/2019: hematocrit 39.1, hemoglobin 12.2, MCV 89.7, platelets 137,000, WBC 7,400 with a normal differential.  Normal studies included: creatinine (1.04), calcium (9.1), LFTs, haptoglobin, LDH, folate, and TSH.  Ferritin was 12 with an iron saturation 15% and a TIBC of 395.  SPEP revealed a 0.4 gm/dL IgA monoclonal protein with kappa light chain specificity.   IgG was 878, IgA 527 (61-437), and IgM 44.   He has a history of a diverticular bleed 3 years ago necessitating admission.  He had an EGD and colonoscopy in [place] about 2 years ago (no report available).  Per patient, there were polyps and no evidence of ulcer or  gastritis. He has not undergone a capsule study.  Guaiac cards were negative on 01/06/2018.  Urinalysis was negative for blood on 12/12/2017.   He is on oral B-12.  Symptomatically, he feels good today.  Exam reveals no adenopathy or hepatosplenomegaly.  Plan: 1.   Labs today:  FLCA, beta2-microglobulin. 2.   Collect 24 hour urine for UPEP and free light chains. 3.   Monoclonal gammopathy  Patient has a IgA monoclonal gammopathy.  Discuss differential diagnosis:  monoclonal gammopathy of unknown significance (MGUS) versus myeloma  Suspect MGUS.  Discuss CRAB criteria for myeloma:  hypercalcemia, renal dysfunction, anemia and bone lesions.  Discuss additional work-up. 4.   Iron deficiency anemia      Hemoglobin 12.2.  MCV 89.7.  Ferritin 12 with an iron saturation of 15%.  Patient has a history of lower GI bleed and subsequent iron deficiency.  Patient denies any current bleeding.  Discuss trial of oral iron with vitamin C.  Goal ferritin 100.  If ineffective, discuss IV iron.   Potential side effects reviewed.   Preauth Venofer if needed.   Information provided.  5.   RTC in 4 weeks for MD assessment, labs (CBC with diff, ferritin- day  before), review of additional testing, and +/- Venofer.  I discussed the assessment and treatment plan with the patient.  The patient was provided an opportunity to ask questions and all were answered.  The patient agreed with the plan and demonstrated an understanding of the instructions.  The patient was advised to call back if the symptoms worsen or if the condition fails to improve as anticipated.   Lequita Asal, MD, PhD    07/25/2019, 11:33 AM

## 2019-07-25 ENCOUNTER — Inpatient Hospital Stay: Payer: Medicare HMO | Attending: Hematology and Oncology | Admitting: Hematology and Oncology

## 2019-07-25 ENCOUNTER — Other Ambulatory Visit: Payer: Self-pay

## 2019-07-25 ENCOUNTER — Encounter: Payer: Self-pay | Admitting: Hematology and Oncology

## 2019-07-25 ENCOUNTER — Inpatient Hospital Stay: Payer: Medicare HMO

## 2019-07-25 VITALS — BP 117/61 | HR 70 | Temp 97.6°F | Resp 18 | Wt 158.0 lb

## 2019-07-25 DIAGNOSIS — I10 Essential (primary) hypertension: Secondary | ICD-10-CM | POA: Diagnosis not present

## 2019-07-25 DIAGNOSIS — C9000 Multiple myeloma not having achieved remission: Secondary | ICD-10-CM | POA: Diagnosis present

## 2019-07-25 DIAGNOSIS — D509 Iron deficiency anemia, unspecified: Secondary | ICD-10-CM | POA: Diagnosis not present

## 2019-07-25 DIAGNOSIS — Z79899 Other long term (current) drug therapy: Secondary | ICD-10-CM | POA: Diagnosis not present

## 2019-07-25 DIAGNOSIS — Z791 Long term (current) use of non-steroidal anti-inflammatories (NSAID): Secondary | ICD-10-CM | POA: Insufficient documentation

## 2019-07-25 DIAGNOSIS — E119 Type 2 diabetes mellitus without complications: Secondary | ICD-10-CM | POA: Insufficient documentation

## 2019-07-25 DIAGNOSIS — Z7982 Long term (current) use of aspirin: Secondary | ICD-10-CM | POA: Insufficient documentation

## 2019-07-25 DIAGNOSIS — D472 Monoclonal gammopathy: Secondary | ICD-10-CM | POA: Diagnosis not present

## 2019-07-25 DIAGNOSIS — E7800 Pure hypercholesterolemia, unspecified: Secondary | ICD-10-CM | POA: Insufficient documentation

## 2019-07-25 DIAGNOSIS — F17210 Nicotine dependence, cigarettes, uncomplicated: Secondary | ICD-10-CM | POA: Diagnosis not present

## 2019-07-25 DIAGNOSIS — Z7984 Long term (current) use of oral hypoglycemic drugs: Secondary | ICD-10-CM | POA: Insufficient documentation

## 2019-07-25 NOTE — Progress Notes (Signed)
Pt here for follow up. Denies any concerns.  

## 2019-07-25 NOTE — Patient Instructions (Addendum)
Patient will try oral iron with orange juice or vitamin C.  If ineffective, will pursue IV iron.   Iron Sucrose injection What is this medicine? IRON SUCROSE (AHY ern SOO krohs) is an iron complex. Iron is used to make healthy red blood cells, which carry oxygen and nutrients throughout the body. This medicine is used to treat iron deficiency anemia in people with chronic kidney disease. This medicine may be used for other purposes; ask your health care provider or pharmacist if you have questions. COMMON BRAND NAME(S): Venofer What should I tell my health care provider before I take this medicine? They need to know if you have any of these conditions:  anemia not caused by low iron levels  heart disease  high levels of iron in the blood  kidney disease  liver disease  an unusual or allergic reaction to iron, other medicines, foods, dyes, or preservatives  pregnant or trying to get pregnant  breast-feeding How should I use this medicine? This medicine is for infusion into a vein. It is given by a health care professional in a hospital or clinic setting. Talk to your pediatrician regarding the use of this medicine in children. While this drug may be prescribed for children as young as 2 years for selected conditions, precautions do apply. Overdosage: If you think you have taken too much of this medicine contact a poison control center or emergency room at once. NOTE: This medicine is only for you. Do not share this medicine with others. What if I miss a dose? It is important not to miss your dose. Call your doctor or health care professional if you are unable to keep an appointment. What may interact with this medicine? Do not take this medicine with any of the following medications:  deferoxamine  dimercaprol  other iron products This medicine may also interact with the following medications:  chloramphenicol  deferasirox This list may not describe all possible  interactions. Give your health care provider a list of all the medicines, herbs, non-prescription drugs, or dietary supplements you use. Also tell them if you smoke, drink alcohol, or use illegal drugs. Some items may interact with your medicine. What should I watch for while using this medicine? Visit your doctor or healthcare professional regularly. Tell your doctor or healthcare professional if your symptoms do not start to get better or if they get worse. You may need blood work done while you are taking this medicine. You may need to follow a special diet. Talk to your doctor. Foods that contain iron include: whole grains/cereals, dried fruits, beans, or peas, leafy green vegetables, and organ meats (liver, kidney). What side effects may I notice from receiving this medicine? Side effects that you should report to your doctor or health care professional as soon as possible:  allergic reactions like skin rash, itching or hives, swelling of the face, lips, or tongue  breathing problems  changes in blood pressure  cough  fast, irregular heartbeat  feeling faint or lightheaded, falls  fever or chills  flushing, sweating, or hot feelings  joint or muscle aches/pains  seizures  swelling of the ankles or feet  unusually weak or tired Side effects that usually do not require medical attention (report to your doctor or health care professional if they continue or are bothersome):  diarrhea  feeling achy  headache  irritation at site where injected  nausea, vomiting  stomach upset  tiredness This list may not describe all possible side effects. Call your  doctor for medical advice about side effects. You may report side effects to FDA at 1-800-FDA-1088. Where should I keep my medicine? This drug is given in a hospital or clinic and will not be stored at home. NOTE: This sheet is a summary. It may not cover all possible information. If you have questions about this medicine,  talk to your doctor, pharmacist, or health care provider.  2020 Elsevier/Gold Standard (2011-08-19 17:14:35)   Iron Deficiency Anemia, Adult Iron-deficiency anemia is when you have a low amount of red blood cells or hemoglobin. This happens because you have too little iron in your body. Hemoglobin carries oxygen to parts of the body. Anemia can cause your body to not get enough oxygen. It may or may not cause symptoms. Follow these instructions at home: Medicines  Take over-the-counter and prescription medicines only as told by your doctor. This includes iron pills (supplements) and vitamins.  If you cannot handle taking iron pills by mouth, ask your doctor about getting iron through: ? A vein (intravenously). ? A shot (injection) into a muscle.  Take iron pills when your stomach is empty. If you cannot handle this, take them with food.  Do not drink milk or take antacids at the same time as your iron pills.  To prevent trouble pooping (constipation), eat fiber or take medicine (stool softener) as told by your doctor. Eating and drinking   Talk with your doctor before changing the foods you eat. He or she may tell you to eat foods that have a lot of iron, such as: ? Liver. ? Lowfat (lean) beef. ? Breads and cereals that have iron added to them (fortified breads and cereals). ? Eggs. ? Dried fruit. ? Dark green, leafy vegetables.  Drink enough fluid to keep your pee (urine) clear or pale yellow.  Eat fresh fruits and vegetables that are high in vitamin C. They help your body to use iron. Foods with a lot of vitamin C include: ? Oranges. ? Peppers. ? Tomatoes. ? Mangoes. General instructions  Return to your normal activities as told by your doctor. Ask your doctor what activities are safe for you.  Keep yourself clean, and keep things clean around you (your surroundings). Anemia can make you get sick more easily.  Keep all follow-up visits as told by your doctor. This is  important. Contact a doctor if:  You feel sick to your stomach (nauseous).  You throw up (vomit).  You feel weak.  You are sweating for no clear reason.  You have trouble pooping, such as: ? Pooping (having a bowel movement) less than 3 times a week. ? Straining to poop. ? Having poop that is hard, dry, or larger than normal. ? Feeling full or bloated. ? Pain in the lower belly. ? Not feeling better after pooping. Get help right away if:  You pass out (faint). If this happens, do not drive yourself to the hospital. Call your local emergency services (911 in the [place]  You have chest pain.  You have shortness of breath that: ? Is very bad. ? Gets worse with physical activity.  You have a fast heartbeat.  You get light-headed when getting up from sitting or lying down. This information is not intended to replace advice given to you by your health care provider. Make sure you discuss any questions you have with your health care provider. Document Released: 12/11/2010 Document Revised: 10/21/2017 Document Reviewed: 07/28/2016 Elsevier Patient Education  2020 [employer].

## 2019-07-26 DIAGNOSIS — C9000 Multiple myeloma not having achieved remission: Secondary | ICD-10-CM | POA: Diagnosis not present

## 2019-07-26 LAB — KAPPA/LAMBDA LIGHT CHAINS
Kappa free light chain: 74 mg/L — ABNORMAL HIGH (ref 3.3–19.4)
Kappa, lambda light chain ratio: 2.92 — ABNORMAL HIGH (ref 0.26–1.65)
Lambda free light chains: 25.3 mg/L (ref 5.7–26.3)

## 2019-07-26 LAB — BETA 2 MICROGLOBULIN, SERUM: Beta-2 Microglobulin: 2 mg/L (ref 0.6–2.4)

## 2019-07-27 ENCOUNTER — Other Ambulatory Visit: Payer: Self-pay

## 2019-07-27 DIAGNOSIS — D472 Monoclonal gammopathy: Secondary | ICD-10-CM

## 2019-07-28 LAB — FREE K+L LT CHAINS,QN,UR
Free Kappa Lt Chains,Ur: 10.06 mg/L (ref 0.63–113.79)
Free Kappa/Lambda Ratio: >15.72 (ref 1.03–31.76)
Free Lambda Lt Chains,Ur: <0.64 mg/L (ref 0.47–11.77)
Total Volume: 1700

## 2019-07-31 LAB — UPEP/TP, 24-HR URINE
Albumin, U: 100 %
Alpha 1, Urine: 0 %
Alpha 2, Urine: 0 %
Beta, Urine: 0 %
Gamma Globulin, Urine: 0 %
Total Protein, Urine-Ur/day: 78 mg/24 hr (ref 30–150)
Total Protein, Urine: 4.6 mg/dL
Total Volume: 1700

## 2019-08-21 ENCOUNTER — Inpatient Hospital Stay: Payer: Medicare HMO

## 2019-08-21 ENCOUNTER — Other Ambulatory Visit: Payer: Self-pay

## 2019-08-21 ENCOUNTER — Encounter: Payer: Self-pay | Admitting: Hematology and Oncology

## 2019-08-21 DIAGNOSIS — D472 Monoclonal gammopathy: Secondary | ICD-10-CM

## 2019-08-21 DIAGNOSIS — D509 Iron deficiency anemia, unspecified: Secondary | ICD-10-CM

## 2019-08-21 DIAGNOSIS — C9000 Multiple myeloma not having achieved remission: Secondary | ICD-10-CM | POA: Diagnosis not present

## 2019-08-21 LAB — CBC WITH DIFFERENTIAL/PLATELET
Abs Immature Granulocytes: 0.04 10*3/uL (ref 0.00–0.07)
Basophils Absolute: 0.1 10*3/uL (ref 0.0–0.1)
Basophils Relative: 1 %
Eosinophils Absolute: 0.2 10*3/uL (ref 0.0–0.5)
Eosinophils Relative: 2 %
HCT: 40.6 % (ref 39.0–52.0)
Hemoglobin: 12.8 g/dL — ABNORMAL LOW (ref 13.0–17.0)
Immature Granulocytes: 1 %
Lymphocytes Relative: 23 %
Lymphs Abs: 1.9 10*3/uL (ref 0.7–4.0)
MCH: 28.5 pg (ref 26.0–34.0)
MCHC: 31.5 g/dL (ref 30.0–36.0)
MCV: 90.4 fL (ref 80.0–100.0)
Monocytes Absolute: 0.6 10*3/uL (ref 0.1–1.0)
Monocytes Relative: 8 %
Neutro Abs: 5.4 10*3/uL (ref 1.7–7.7)
Neutrophils Relative %: 65 %
Platelets: 134 10*3/uL — ABNORMAL LOW (ref 150–400)
RBC: 4.49 MIL/uL (ref 4.22–5.81)
RDW: 15.2 % (ref 11.5–15.5)
WBC: 8.2 10*3/uL (ref 4.0–10.5)
nRBC: 0 % (ref 0.0–0.2)

## 2019-08-21 LAB — FERRITIN: Ferritin: 20 ng/mL — ABNORMAL LOW (ref 24–336)

## 2019-08-21 NOTE — Progress Notes (Signed)
No new changes noted today. The patient Name and DOB has been verified by phone. 

## 2019-08-22 ENCOUNTER — Inpatient Hospital Stay (HOSPITAL_BASED_OUTPATIENT_CLINIC_OR_DEPARTMENT_OTHER): Payer: Medicare HMO | Admitting: Hematology and Oncology

## 2019-08-22 ENCOUNTER — Encounter: Payer: Self-pay | Admitting: Hematology and Oncology

## 2019-08-22 ENCOUNTER — Inpatient Hospital Stay: Payer: Medicare HMO

## 2019-08-22 VITALS — BP 152/79 | HR 72 | Temp 96.0°F | Resp 16 | Wt 160.9 lb

## 2019-08-22 DIAGNOSIS — C9000 Multiple myeloma not having achieved remission: Secondary | ICD-10-CM | POA: Diagnosis not present

## 2019-08-22 DIAGNOSIS — D472 Monoclonal gammopathy: Secondary | ICD-10-CM | POA: Diagnosis not present

## 2019-08-22 DIAGNOSIS — D509 Iron deficiency anemia, unspecified: Secondary | ICD-10-CM | POA: Diagnosis not present

## 2019-08-22 NOTE — Progress Notes (Signed)
[facility]  [address] [place], [place] [address] Phone: [phone]  Fax: [phone]   Clinic Day:  08/22/2019  Referring physician: Weber Cooks, MD  Chief Complaint: Ryan Serrano is a 85 y.o. male with a monoclonal gammopathy and iron deficiency anemia who is seen for 1 month assessment.  HPI: The patient was last seen in the hematology clinic on 07/25/2019. At that time, he felt good.  Exam revealed no adenopathy or hepatosplenomegaly.  CBC on 07/04/2019 included a hematocrit of 39.1, hemoglobin 12.2, MCV 89.7, platelets 137,000, white count 7400 with an [place] 5400.  SPEP revealed an M-spike of 0.4 gm/dL IgA monoclonal protein with kappa light chain specificity.  Labs on 07/25/2019 included kappa free light chains 74, lambda light chains 25.3 and ratio 2.92 (0.26 - 1.65).  Beta-2 microglobulin was 2.0 (normal).  24-hour UPEP revealed no monoclonal protein with kappa/lambda free light chain ratio of > 15.72 (1.03 - 31.76)  Labs on 08/21/2019 included a hematocrit 40.6, hemoglobin 12.8, MCV 90.4, platelets 134,000, and WBC 8200.  Ferritin was 20.   During the interim, he is still feeling good.  He denies any complaints. He still has some joint pain and diabetic neuropathy.  He is taking 2 oral iron tablets a day with no problems.  He was encouraged to take oral iron with vitamin C or orange juice.   Past Medical History:  Diagnosis Date  . Diabetes mellitus without complication ([place])   . GERD (gastroesophageal reflux disease)   . High cholesterol   . Hypertension   . Kidney stones     Past Surgical History:  Procedure Laterality Date  . ESOPHAGUS SURGERY    . FINGER AMPUTATION Left   . HEMORRHOID BANDING    . HERNIA REPAIR    . KIDNEY STONE SURGERY      Family History  Problem Relation Age of Onset  . Hypertension Mother   . Cancer Father   . Diabetes Father   . Hypertension Father     Social History:  reports that he quit  smoking about 40 years ago. His smoking use included cigarettes. He quit after 16.00 years of use. His smokeless tobacco use includes chew. He reports that he does not drink alcohol or use drugs. He denies any tobacco or alcohol use. He denies any known exposure to radiation or toxins. Hecurrently works farming soybeans, corn, and wheat. He lives in [place].He has been married for 55 years and has 4 children. His daughter's name is [name] [phone]).   His nephew died during the interim.  The patient is alone today.  Allergies:  Allergies  Allergen Reactions  . Hydrochlorothiazide     Other reaction(s): hyponatremia-stopped by cards  . Penicillins Anaphylaxis and Other (See Comments)  . Atorvastatin Other (See Comments)    Other reaction(s): myalgias 12-06   . Bee Venom Other (See Comments)  . Hydrocodone Other (See Comments)  . Hydrocodone-Acetaminophen Other (See Comments)    Nausea/vomotting Nausea/vomotting     Current Medications: Current Outpatient Medications  Medication Sig Dispense Refill  . acetaminophen (ACETAMINOPHEN 8 HOUR) 650 MG CR tablet Take 1,300 mg by mouth 2 (two) times daily.    Marland Kitchen amLODipine (NORVASC) 5 MG tablet Take 5 mg by mouth daily.     Marland Kitchen aspirin EC 81 MG tablet Take 81 mg by mouth daily.     Marland Kitchen gabapentin (NEURONTIN) 300 MG capsule 600 mg Three (3) times a day.    . lisinopril (ZESTRIL)  40 MG tablet Take 40 mg by mouth daily.     . metFORMIN (GLUCOPHAGE-XR) 500 MG 24 hr tablet Take 500 mg by mouth 2 (two) times daily.     . pantoprazole (PROTONIX) 20 MG tablet Take 20 mg by mouth daily.    . simvastatin (ZOCOR) 20 MG tablet Take 20 mg by mouth daily at 6 PM.     . tamsulosin (FLOMAX) 0.4 MG CAPS capsule Take 0.4 mg by mouth daily.     . vitamin B-12 (CYANOCOBALAMIN) 1000 MCG tablet Take 1,000 mcg by mouth daily.    . diclofenac sodium (VOLTAREN) 1 % GEL Apply 2 g topically as needed.     . fluticasone (FLONASE) 50 MCG/ACT nasal spray 1 spray by Each  Nare route daily.     No current facility-administered medications for this visit.     Review of Systems  Constitutional: Negative.  Negative for chills, diaphoresis, fever, malaise/fatigue and weight loss (up 3 pounds).       Feels "good".  HENT: Negative.  Negative for congestion, ear pain, hearing loss, nosebleeds, sinus pain and sore throat.   Eyes: Negative.  Negative for blurred vision, double vision and pain.  Respiratory: Negative.  Negative for cough, sputum production, shortness of breath and wheezing.   Cardiovascular: Negative.  Negative for chest pain, palpitations, orthopnea, leg swelling and PND.  Gastrointestinal: Negative.  Negative for blood in stool, constipation, diarrhea, melena, nausea and vomiting.  Genitourinary: Negative.  Negative for dysuria, frequency, hematuria and urgency.  Musculoskeletal: Positive for joint pain (chronic). Negative for back pain and myalgias.  Skin: Negative.  Negative for itching and rash.  Neurological: Positive for sensory change (diabetic neuropathy). Negative for tingling, focal weakness, weakness and headaches.  Endo/Heme/Allergies: Negative.  Does not bruise/bleed easily.       Diabetes.  Psychiatric/Behavioral: Negative.  Negative for depression, memory loss and substance abuse. The patient is not nervous/anxious and does not have insomnia.   All other systems reviewed and are negative.  Performance status (ECOG): 0  Vitals Blood pressure (!) 152/79, pulse 72, temperature (!) 96 F (35.6 C), temperature source Tympanic, resp. rate 16, weight 160 lb 15 oz (73 kg), SpO2 97 %.   Physical Exam  Constitutional: He is oriented to person, place, and time. He appears well-developed and well-nourished. No distress.  HENT:  Head: Normocephalic and atraumatic.  Black cap and mask.  Short gray hair.  Eyes: Pupils are equal, round, and reactive to light. Conjunctivae and EOM are normal. No scleral icterus.  Brown eyes.  Neck: Normal range  of motion. Neck supple. No JVD present.  Cardiovascular: Normal rate, regular rhythm and normal heart sounds. Exam reveals no gallop and no friction rub.  No murmur heard. Pulmonary/Chest: Effort normal and breath sounds normal. He has no wheezes. He has no rales.  Abdominal: Soft. Bowel sounds are normal. He exhibits no mass. There is no abdominal tenderness. There is no rebound.  Musculoskeletal: Normal range of motion.     Comments: S/p left index finger partial amputation.  Lymphadenopathy:    He has no cervical adenopathy.  Neurological: He is alert and oriented to person, place, and time.  Skin: Skin is warm and dry. No rash noted. He is not diaphoretic. No erythema. No pallor.  Psychiatric: He has a normal mood and affect. His behavior is normal. Judgment and thought content normal.  Nursing note and vitals reviewed.   Appointment on 08/21/2019  Component Date Value Ref Range Status  .  Ferritin 08/21/2019 20* 24 - 336 ng/mL Final   Performed at [facility], [address] [place], [place] [address]  . WBC 08/21/2019 8.2  4.0 - 10.5 K/uL Final  . RBC 08/21/2019 4.49  4.22 - 5.81 MIL/uL Final  . Hemoglobin 08/21/2019 12.8* 13.0 - 17.0 g/dL Final  . HCT 08/21/2019 40.6  39.0 - 52.0 % Final  . MCV 08/21/2019 90.4  80.0 - 100.0 fL Final  . MCH 08/21/2019 28.5  26.0 - 34.0 pg Final  . MCHC 08/21/2019 31.5  30.0 - 36.0 g/dL Final  . RDW 08/21/2019 15.2  11.5 - 15.5 % Final  . Platelets 08/21/2019 134* 150 - 400 K/uL Final  . nRBC 08/21/2019 0.0  0.0 - 0.2 % Final  . Neutrophils Relative % 08/21/2019 65  % Final  . Neutro Abs 08/21/2019 5.4  1.7 - 7.7 K/uL Final  . Lymphocytes Relative 08/21/2019 23  % Final  . Lymphs Abs 08/21/2019 1.9  0.7 - 4.0 K/uL Final  . Monocytes Relative 08/21/2019 8  % Final  . Monocytes Absolute 08/21/2019 0.6  0.1 - 1.0 K/uL Final  . Eosinophils Relative 08/21/2019 2  % Final  . Eosinophils Absolute 08/21/2019 0.2  0.0 - 0.5 K/uL Final  .  Basophils Relative 08/21/2019 1  % Final  . Basophils Absolute 08/21/2019 0.1  0.0 - 0.1 K/uL Final  . Immature Granulocytes 08/21/2019 1  % Final  . Abs Immature Granulocytes 08/21/2019 0.04  0.00 - 0.07 K/uL Final   Performed at [facility], [address], [place], [place] [address]    Assessment:  Ryan Serrano is a 85 y.o. male with iron deficiency and an IgA monoclonal gammopathy. Hemoglobinhas ranged from 11.2 - 11.5.  [facility] labs from 09/26/2018 - 03/05/2020revealed following normal studies: B12, LDH, Coombs. MMA was 17.8 (0.3 - 2.8).   Work-up on 07/04/2019: hematocrit 39.1, hemoglobin 12.2, MCV 89.7, platelets 137,000, WBC 7,400 with a normal differential.  Normal studies included: creatinine (1.04), calcium (9.1), LFTs, haptoglobin, LDH, folate, and TSH.  Ferritin was 12 with an iron saturation 15% and a TIBC of 395.  SPEP revealed a 0.4 gm/dL IgA monoclonal protein with kappa light chain specificity.   IgG was 878, IgA 527 (61-437), and IgM 44.  Kappa free light chains 74, lambda light chains 25.3 and ratio 2.92 (0.26 - 1.65) on 07/25/2019.  Beta-2 microglobulin was 2.0 (normal).  24-hour UPEP revealed no monoclonal protein with kappa/lambda free light chain ratio of > 15.72 (1.03 - 31.76).   He has a history of a diverticularbleed3 years ago necessitating admission. He had an EGD and colonoscopyin [place] about 2 years ago (no report available). Per patient,there were polyps andno evidence ofulcer orgastritis. He has not undergone a capsule study.Guaiac cardswere negative on 01/06/2018. Urinalysiswas negative for blood on 12/12/2017.   He has iron deficiency.  Ferritin was 12 on 07/04/2019 and 20 on 08/21/2019.  He is on oral B-12.  Symptomatically, he is doing well.  Exam is stable.  Plan: 1.   Review labs from 08/21/2019. 2.   Monoclonal gammopathy of unknown significance (MGUS)             Patient has an IgA monoclonal gammopathy.              M-spike is 0.4 gm/dL.  Review interval UPEP- no monoclonal protein.  Review CRAB criteria:  hypercalcemia, renal dysfunction, anemia and bone lesions.  Continue to monitor. 3.   Iron deficiency anemia           Hemoglobin 12.2.  MCV 89.7 on 07/04/2019.       Hemoglobin 12.8.  MCV 90.4 on 08/21/2019.   Ferritin was 12 on 07/04/2019 and 20 on 08/21/2019.       He has a history of lower GI bleed and subsequent iron deficiency.       He denies any bleeding.       Continue oral iron with vitamin C.    Ferritin goal is 100.   4.   RTC in 3 months for MD assess and labs (CBC with diff, CMP, SPEP, ferritin, iron studies).  I discussed the assessment and treatment plan with the patient.  The patient was provided an opportunity to ask questions and all were answered.  The patient agreed with the plan and demonstrated an understanding of the instructions.  The patient was advised to call back if the symptoms worsen or if the condition fails to improve as anticipated.   Lequita Asal, MD, PhD    08/22/2019, 10:34 AM  I, Samul Dada, am acting as a scribe for Lequita Asal, MD.  I, [employer] Mike Gip, MD, have reviewed the above documentation for accuracy and completeness, and I agree with the above.

## 2019-08-22 NOTE — Progress Notes (Signed)
Pt in for follow up, reports having cramping and aching in feet".

## 2019-11-19 NOTE — Progress Notes (Signed)
[facility]  [address] [place], [place] [address] Phone: [phone]  Fax: [phone]   Telephone Office Visit:  11/21/2019   Referring physician: Weber Cooks, MD  I connected with Yves Dill on 11/21/2019 at 11:45 AM  by telephone and verified that I was speaking with the correct person using 2 identifiers.  The patient was at a service station in [place].  I discussed the limitations, risk, security and privacy concerns of performing an evaluation and management service by telephone and the availability of in person appointments.  I also discussed with the patient that there may be a patient responsible charge related to this service.  The patient expressed understanding and agreed to proceed.   Chief Complaint: Ryan Serrano is a 85 y.o. male with a monoclonal gammopathy and iron deficiency anemia who is seen for 3 month assessment   HPI: The patient was last seen in the hematology clinic on 08/22/2019. At that time, he was doing well.  Exam was stable.  Hematocrit was 40.6 and hemoglobin 12.8  Ferritin was 20.  He continued oral iron.  During the interim, he has felt "fine".  He is still working. 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  He got a good report from his eye doctor, he saying he doesn't need glasses. His weight is good and notes he gained 5 lbs.  He puts a pain patch on his back at the beginning of the day. He says his neuropathy from his diabetes is doing alright. His BP is good. Last reading he remember was 121/78 with a pulse around 77.  He denies any bleeding.  He denies any interval infections.   Past Medical History:  Diagnosis Date  . Diabetes mellitus without complication ([place])   . GERD (gastroesophageal reflux disease)   . High cholesterol   . Hypertension   . Kidney stones     Past Surgical History:  Procedure Laterality Date  . ESOPHAGUS SURGERY    . FINGER AMPUTATION Left   . HEMORRHOID BANDING    . HERNIA  REPAIR    . KIDNEY STONE SURGERY      Family History  Problem Relation Age of Onset  . Hypertension Mother   . Cancer Father   . Diabetes Father   . Hypertension Father     Social History:  reports that he quit smoking about 41 years ago. His smoking use included cigarettes. He quit after 16.00 years of use. His smokeless tobacco use includes chew. He reports that he does not drink alcohol or use drugs. He denies any known exposure to radiation or toxins. Hecurrently works farming soybeans, corn, and wheat. He lives in [place].He was been married for 55 years and has 4 children.His wife died 2 years ago.  His daughter's name is [name] [phone]).He can be reached at [phone])  His nephew died during the interim.  The patient is alone today.  Participants in the patient's visit and their role in the encounter included the patient, Samul Dada, scribe, and [employer], CMA, today.  The intake visit was provided by Samul Dada, scribe & Vito Berger, CMA.   Allergies:  Allergies  Allergen Reactions  . Hydrochlorothiazide     Other reaction(s): hyponatremia-stopped by cards  . Penicillins Anaphylaxis and Other (See Comments)  . Atorvastatin Other (See Comments)    Other reaction(s): myalgias 12-06   . Bee Venom Other (See Comments)  . Hydrocodone Other (See Comments)  . Hydrocodone-Acetaminophen Other (See  Comments)    Nausea/vomotting Nausea/vomotting     Current Medications: Current Outpatient Medications  Medication Sig Dispense Refill  . acetaminophen (ACETAMINOPHEN 8 HOUR) 650 MG CR tablet Take 1,300 mg by mouth 2 (two) times daily.    Marland Kitchen amLODipine (NORVASC) 5 MG tablet Take 5 mg by mouth daily.     Marland Kitchen aspirin EC 81 MG tablet Take 81 mg by mouth daily.     . diclofenac sodium (VOLTAREN) 1 % GEL Apply 2 g topically as needed.     . fluticasone (FLONASE) 50 MCG/ACT nasal spray 1 spray by Each Nare route daily.    Marland Kitchen gabapentin (NEURONTIN) 300 MG capsule  600 mg Three (3) times a day.    . lisinopril (ZESTRIL) 40 MG tablet Take 40 mg by mouth daily.     . metFORMIN (GLUCOPHAGE-XR) 500 MG 24 hr tablet Take 500 mg by mouth 2 (two) times daily.     . pantoprazole (PROTONIX) 20 MG tablet Take 20 mg by mouth daily.    . simvastatin (ZOCOR) 20 MG tablet Take 20 mg by mouth daily at 6 PM.     . tamsulosin (FLOMAX) 0.4 MG CAPS capsule Take 0.4 mg by mouth daily.     . vitamin B-12 (CYANOCOBALAMIN) 1000 MCG tablet Take 1,000 mcg by mouth daily.     No current facility-administered medications for this visit.    Review of Systems  Constitutional: Negative.  Negative for chills, diaphoresis, fever, malaise/fatigue and weight loss (up 5 pounds).       Feels "good".  HENT: Negative.  Negative for congestion, ear pain, hearing loss, nosebleeds, sinus pain and sore throat.   Eyes: Negative.  Negative for blurred vision, double vision and pain.  Respiratory: Negative.  Negative for cough, sputum production, shortness of breath and wheezing.   Cardiovascular: Negative.  Negative for chest pain, palpitations, orthopnea, leg swelling and PND.  Gastrointestinal: Negative.  Negative for blood in stool, constipation, diarrhea, melena, nausea and vomiting.  Genitourinary: Negative.  Negative for dysuria, frequency, hematuria and urgency.  Musculoskeletal: Positive for back pain and joint pain (chronic). Negative for myalgias.  Skin: Negative.  Negative for itching and rash.  Neurological: Positive for sensory change (diabetic neuropathy). Negative for tingling, focal weakness, weakness and headaches.  Endo/Heme/Allergies: Negative.  Does not bruise/bleed easily.       Diabetes.  Psychiatric/Behavioral: Negative.  Negative for depression, memory loss and substance abuse. The patient is not nervous/anxious and does not have insomnia.   All other systems reviewed and are negative.  Performance status (ECOG): 0  Vitals There were no vitals taken for this visit.     Physical Exam  Constitutional: He is oriented to person, place, and time.  Neurological: He is alert and oriented to person, place, and time.  Psychiatric: He has a normal mood and affect. His behavior is normal. Judgment and thought content normal.  Nursing note reviewed.   No visits with results within 3 Day(s) from this visit.  Latest known visit with results is:  Appointment on 08/21/2019  Component Date Value Ref Range Status  . Ferritin 08/21/2019 20* 24 - 336 ng/mL Final   Performed at [facility], [address] [place], [place] [address]  . WBC 08/21/2019 8.2  4.0 - 10.5 K/uL Final  . RBC 08/21/2019 4.49  4.22 - 5.81 MIL/uL Final  . Hemoglobin 08/21/2019 12.8* 13.0 - 17.0 g/dL Final  . HCT 08/21/2019 40.6  39.0 - 52.0 % Final  .  MCV 08/21/2019 90.4  80.0 - 100.0 fL Final  . MCH 08/21/2019 28.5  26.0 - 34.0 pg Final  . MCHC 08/21/2019 31.5  30.0 - 36.0 g/dL Final  . RDW 08/21/2019 15.2  11.5 - 15.5 % Final  . Platelets 08/21/2019 134* 150 - 400 K/uL Final  . nRBC 08/21/2019 0.0  0.0 - 0.2 % Final  . Neutrophils Relative % 08/21/2019 65  % Final  . Neutro Abs 08/21/2019 5.4  1.7 - 7.7 K/uL Final  . Lymphocytes Relative 08/21/2019 23  % Final  . Lymphs Abs 08/21/2019 1.9  0.7 - 4.0 K/uL Final  . Monocytes Relative 08/21/2019 8  % Final  . Monocytes Absolute 08/21/2019 0.6  0.1 - 1.0 K/uL Final  . Eosinophils Relative 08/21/2019 2  % Final  . Eosinophils Absolute 08/21/2019 0.2  0.0 - 0.5 K/uL Final  . Basophils Relative 08/21/2019 1  % Final  . Basophils Absolute 08/21/2019 0.1  0.0 - 0.1 K/uL Final  . Immature Granulocytes 08/21/2019 1  % Final  . Abs Immature Granulocytes 08/21/2019 0.04  0.00 - 0.07 K/uL Final   Performed at [facility], [address] [place], [place] [address]    Assessment:  Martelle Heitzman is a 85 y.o. male withiron deficiencyand an IgA monoclonal gammopathy. Hemoglobinhas ranged from 11.2 - 11.5.  [facility]  labs from 09/26/2018 - 03/05/2020revealed following normal studies: B12, LDH, Coombs. MMA was 17.8 (0.3 - 2.8).  Work-up on 07/04/2019:hematocrit 39.1, hemoglobin 12.2, MCV 89.7, platelets 137,000, WBC 7,400 with a normal differential.Normal studiesincluded: creatinine (1.04), calcium (9.1), LFTs, haptoglobin, LDH, folate, and TSH. Ferritinwas 12 with an iron saturation 15%and a TIBC of 395.SPEPrevealed a 0.4gm/dL IgA monoclonal protein with kappa light chain specificity.IgG was878, IgA 527(61-437),andIgM 44.  Kappa free light chains 74, lambda light chains 25.3 and ratio 2.92 (0.26 - 1.65) on 07/25/2019.  Beta-2 microglobulin was 2.0 (normal).  24-hour UPEP revealed no monoclonal protein with kappa/lambda free light chain ratio of > 15.72 (1.03 - 31.76).  SPEP has been followed (gm/dL): 0.4 on 07/04/2019, 0.1 on 11/11/2019.  He has a history of a diverticularbleed3 years ago necessitating admission. He had an EGD and colonoscopyin [place] about 2 years ago (no report available). Per patient,there were polyps andno evidence ofulcer orgastritis. He has not undergone a capsule study.Guaiac cardswere negative on 01/06/2018. Urinalysiswas negative for blood on 12/12/2017.   He has iron deficiency.  Ferritin was 12 on 07/04/2019, 20 on 08/21/2019, and 26 on 11/21/2019.  He is on oral B12.  Symptomatically, he feels "good".  He denies any fevers or infections.  He denies any bruising or bleeding.  Plan: 1.   Labs today: CBC with diff, CMP, SPEP, ferritin, iron studies. 2.   Monoclonal gammopathy of unknown significance (MGUS) He has an IgA monoclonal gammopathy. M-spike is 0.1 gm/dL.             UPEP revealed no monoclonal protein.             Review CRAB criteria: hypercalcemia, renal dysfunction, anemia and bone lesions. Continue to monitor. 3. Iron deficiencyanemia Hemoglobin 12.2. MCV 89.7 on  07/04/2019.   Ferritin 12. Hemoglobin 12.8.  MCV 90.4 on 08/21/2019.    Ferritin 20.       Hemoglobin 13.8.  MCV 92.1 on 11/21/2019.   Ferritin 26. He has a history of lower GI bleed and subsequent iron deficiency. He denies any bleeding. Continue oral iron with vitamin C.  Ferritin goal is 100.   4.  RN to call patient with results (drawn after clinic). 5.   RTC in 3 months for labs (CBC, ferritin). 6.   RTC in 6 months for MD assessment and labs (CBC with diff, CMP, SPEP, ferritin, iron studies).  I discussed the assessment and treatment plan with the patient.  The patient was provided an opportunity to ask questions and all were answered.  The patient agreed with the plan and demonstrated an understanding of the instructions.  The patient was advised to call back if the symptoms worsen or if the condition fails to improve as anticipated.  I provided 16 minutes (11:45 AM - 12:01 PM) of non face-to-face time during this this encounter and > 50% was spent counseling as documented under my assessment and plan.    Lequita Asal, MD, PhD    11/21/2019, 12:01 PM  I, Samul Dada, am acting as a scribe for Lequita Asal, MD.  I, [place] Mike Gip, MD, have reviewed the above documentation for accuracy and completeness, and I agree with the above.

## 2019-11-20 ENCOUNTER — Other Ambulatory Visit: Payer: Self-pay

## 2019-11-20 ENCOUNTER — Encounter: Payer: Self-pay | Admitting: Hematology and Oncology

## 2019-11-21 ENCOUNTER — Encounter: Payer: Self-pay | Admitting: Hematology and Oncology

## 2019-11-21 ENCOUNTER — Inpatient Hospital Stay: Payer: Medicare HMO | Attending: Hematology and Oncology | Admitting: Hematology and Oncology

## 2019-11-21 ENCOUNTER — Inpatient Hospital Stay: Payer: Medicare HMO

## 2019-11-21 DIAGNOSIS — D509 Iron deficiency anemia, unspecified: Secondary | ICD-10-CM | POA: Diagnosis not present

## 2019-11-21 DIAGNOSIS — D472 Monoclonal gammopathy: Secondary | ICD-10-CM | POA: Diagnosis not present

## 2019-11-21 LAB — CBC WITH DIFFERENTIAL/PLATELET
Abs Immature Granulocytes: 0.02 10*3/uL (ref 0.00–0.07)
Basophils Absolute: 0.1 10*3/uL (ref 0.0–0.1)
Basophils Relative: 1 %
Eosinophils Absolute: 0.1 10*3/uL (ref 0.0–0.5)
Eosinophils Relative: 1 %
HCT: 42 % (ref 39.0–52.0)
Hemoglobin: 13.8 g/dL (ref 13.0–17.0)
Immature Granulocytes: 0 %
Lymphocytes Relative: 19 %
Lymphs Abs: 1.3 10*3/uL (ref 0.7–4.0)
MCH: 30.3 pg (ref 26.0–34.0)
MCHC: 32.9 g/dL (ref 30.0–36.0)
MCV: 92.1 fL (ref 80.0–100.0)
Monocytes Absolute: 0.5 10*3/uL (ref 0.1–1.0)
Monocytes Relative: 8 %
Neutro Abs: 4.9 10*3/uL (ref 1.7–7.7)
Neutrophils Relative %: 71 %
Platelets: 132 10*3/uL — ABNORMAL LOW (ref 150–400)
RBC: 4.56 MIL/uL (ref 4.22–5.81)
RDW: 13.4 % (ref 11.5–15.5)
WBC: 6.9 10*3/uL (ref 4.0–10.5)
nRBC: 0 % (ref 0.0–0.2)

## 2019-11-21 LAB — COMPREHENSIVE METABOLIC PANEL
ALT: 12 U/L (ref 0–44)
AST: 14 U/L — ABNORMAL LOW (ref 15–41)
Albumin: 4 g/dL (ref 3.5–5.0)
Alkaline Phosphatase: 52 U/L (ref 38–126)
Anion gap: 8 (ref 5–15)
BUN: 10 mg/dL (ref 8–23)
CO2: 25 mmol/L (ref 22–32)
Calcium: 9 mg/dL (ref 8.9–10.3)
Chloride: 104 mmol/L (ref 98–111)
Creatinine, Ser: 1.07 mg/dL (ref 0.61–1.24)
GFR calc Af Amer: >60 mL/min (ref >60)
GFR calc non Af Amer: >60 mL/min (ref >60)
Glucose, Bld: 126 mg/dL — ABNORMAL HIGH (ref 70–99)
Potassium: 3.7 mmol/L (ref 3.5–5.1)
Sodium: 137 mmol/L (ref 135–145)
Total Bilirubin: 0.7 mg/dL (ref 0.3–1.2)
Total Protein: 7.2 g/dL (ref 6.5–8.1)

## 2019-11-21 LAB — FERRITIN: Ferritin: 26 ng/mL (ref 24–336)

## 2019-11-21 LAB — IRON AND TIBC
Iron: 99 ug/dL (ref 45–182)
Saturation Ratios: 28 % (ref 17.9–39.5)
TIBC: 354 ug/dL (ref 250–450)
UIBC: 255 ug/dL

## 2019-11-22 LAB — PROTEIN ELECTROPHORESIS, SERUM
A/G Ratio: 1.3 (ref 0.7–1.7)
Albumin ELP: 3.7 g/dL (ref 2.9–4.4)
Alpha-1-Globulin: 0.2 g/dL (ref 0.0–0.4)
Alpha-2-Globulin: 0.7 g/dL (ref 0.4–1.0)
Beta Globulin: 1.2 g/dL (ref 0.7–1.3)
Gamma Globulin: 0.7 g/dL (ref 0.4–1.8)
Globulin, Total: 2.8 g/dL (ref 2.2–3.9)
M-Spike, %: 0.1 g/dL — ABNORMAL HIGH
Total Protein ELP: 6.5 g/dL (ref 6.0–8.5)

## 2020-02-19 ENCOUNTER — Inpatient Hospital Stay: Payer: Medicare HMO | Attending: Hematology and Oncology

## 2020-02-19 DIAGNOSIS — D472 Monoclonal gammopathy: Secondary | ICD-10-CM | POA: Diagnosis present

## 2020-02-19 DIAGNOSIS — D509 Iron deficiency anemia, unspecified: Secondary | ICD-10-CM | POA: Insufficient documentation

## 2020-02-19 LAB — CBC
HCT: 41.4 % (ref 39.0–52.0)
Hemoglobin: 13.4 g/dL (ref 13.0–17.0)
MCH: 30.3 pg (ref 26.0–34.0)
MCHC: 32.4 g/dL (ref 30.0–36.0)
MCV: 93.7 fL (ref 80.0–100.0)
Platelets: 125 10*3/uL — ABNORMAL LOW (ref 150–400)
RBC: 4.42 MIL/uL (ref 4.22–5.81)
RDW: 13.4 % (ref 11.5–15.5)
WBC: 7.9 10*3/uL (ref 4.0–10.5)
nRBC: 0 % (ref 0.0–0.2)

## 2020-02-19 LAB — FERRITIN: Ferritin: 43 ng/mL (ref 24–336)

## 2020-05-19 NOTE — Progress Notes (Signed)
[facility]  [address] [place],  [address] Phone: [phone]  Fax: [phone]   Clinic Day:  05/21/2020   Referring physician: Weber Cooks, MD  Chief Complaint: Ryan Serrano is a 86 y.o. male with a monoclonal gammopathy and iron deficiency anemia who is seen for 6 month assessment.  HPI: The patient was last seen in the hematology clinic via telemedicine on 11/21/2019. At that time, he felt "good".  He denied any fevers or infections.  He denied any bruising or bleeding. Hematocrit was 42.0, hemoglobin 13.8, platelets 132,000, WBC 6,900. Ferritin was 26 with an iron saturation of 28% and a TIBC of 354. M-spike was 0.1 gm/dL. He continued oral iron.  Labs from 02/19/2020 revealed a hematocrit 41.4, hemoglobin 13.4, platelets 125,000, WBC 7,900. Ferritin was 43.  During the interim, he is feeling "fair." He is having some right sided back pain, which he thinks is from sitting on a bucket while picking beans. He is eating well, but has diarrhea and black stools often. He takes one iron pill daily. His neuropathy is stable and he uses a cream for it. The patient has had right hip pain since he fell years ago. He denies nausea and vomiting. His last colonoscopy was years ago and he has not followed up with his GI doctor. He reports that his bowels "have been leaking."  The patient received his COVID-19 vaccines on 01/18/2020 and in 69/4854 and had no complications.   Past Medical History:  Diagnosis Date  . Diabetes mellitus without complication ([place])   . GERD (gastroesophageal reflux disease)   . High cholesterol   . Hypertension   . Kidney stones     Past Surgical History:  Procedure Laterality Date  . ESOPHAGUS SURGERY    . FINGER AMPUTATION Left   . HEMORRHOID BANDING    . HERNIA REPAIR    . KIDNEY STONE SURGERY      Family History  Problem Relation Age of Onset  . Hypertension Mother   . Cancer Father   . Diabetes Father    . Hypertension Father     Social History:  reports that he quit smoking about 41 years ago. His smoking use included cigarettes. He quit after 16.00 years of use. His smokeless tobacco use includes chew. He reports that he does not drink alcohol and does not use drugs. He denies any known exposure to radiation or toxins. Hecurrently works farming soybeans, corn, and wheat. He lives in [place].He was been married for 55 years and has 4 children.His wife died 2 years ago.  His daughter's name is Ryan Serrano [phone]).He can be reached at [phone]  His nephew passed away.  The patient is alone today.  Allergies:  Allergies  Allergen Reactions  . Hydrochlorothiazide     Other reaction(s): hyponatremia-stopped by cards  . Penicillins Anaphylaxis and Other (See Comments)  . Atorvastatin Other (See Comments)    Other reaction(s): myalgias 12-06   . Bee Venom Other (See Comments)  . Hydrocodone Other (See Comments)  . Hydrocodone-Acetaminophen Other (See Comments)    Nausea/vomotting Nausea/vomotting     Current Medications: Current Outpatient Medications  Medication Sig Dispense Refill  . acetaminophen (ACETAMINOPHEN 8 HOUR) 650 MG CR tablet Take 1,300 mg by mouth 2 (two) times daily.    Marland Kitchen amLODipine (NORVASC) 5 MG tablet Take 5 mg by mouth daily.     Marland Kitchen aspirin EC 81 MG tablet Take 81 mg by mouth daily.     Marland Kitchen  diclofenac sodium (VOLTAREN) 1 % GEL Apply 2 g topically as needed.     . fluticasone (FLONASE) 50 MCG/ACT nasal spray 1 spray by Each Nare route daily.    Marland Kitchen gabapentin (NEURONTIN) 300 MG capsule 600 mg Three (3) times a day.    . gabapentin (NEURONTIN) 600 MG tablet     . lisinopril (ZESTRIL) 40 MG tablet Take 40 mg by mouth daily.     . metFORMIN (GLUCOPHAGE-XR) 500 MG 24 hr tablet Take 500 mg by mouth 2 (two) times daily.     . pantoprazole (PROTONIX) 20 MG tablet Take 20 mg by mouth daily.    . simvastatin (ZOCOR) 20 MG tablet Take 20 mg by mouth daily at 6 PM.     .  tamsulosin (FLOMAX) 0.4 MG CAPS capsule Take 0.4 mg by mouth daily.     . vitamin B-12 (CYANOCOBALAMIN) 1000 MCG tablet Take 1,000 mcg by mouth daily.     No current facility-administered medications for this visit.    Review of Systems  Constitutional: Positive for weight loss (3 lbs). Negative for chills, diaphoresis, fever and malaise/fatigue.       Feels "good".  HENT: Negative.  Negative for congestion, ear pain, hearing loss, nosebleeds, sinus pain and sore throat.   Eyes: Negative.  Negative for blurred vision, double vision and pain.  Respiratory: Negative.  Negative for cough, sputum production, shortness of breath and wheezing.   Cardiovascular: Negative.  Negative for chest pain, palpitations, orthopnea, leg swelling and PND.  Gastrointestinal: Positive for diarrhea (often) and melena (on oral iron). Negative for abdominal pain, blood in stool, constipation, heartburn, nausea and vomiting.       Eating well. Reports that his bowels "have been leaking."  Genitourinary: Negative.  Negative for dysuria, frequency, hematuria and urgency.  Musculoskeletal: Positive for back pain (right sided) and joint pain (right hip, from fall years ago). Negative for myalgias.  Skin: Negative.  Negative for itching and rash.  Neurological: Positive for sensory change (diabetic neuropathy, stable). Negative for tingling, focal weakness, weakness and headaches.  Endo/Heme/Allergies: Negative.  Does not bruise/bleed easily.       Diabetes.  Psychiatric/Behavioral: Negative.  Negative for depression, memory loss and substance abuse. The patient is not nervous/anxious and does not have insomnia.   All other systems reviewed and are negative.  Performance status (ECOG): 0-1  Vitals Blood pressure 126/78, pulse 73, temperature (!) 97.2 F (36.2 C), temperature source Tympanic, resp. rate 18, weight 157 lb 10.1 oz (71.5 kg), SpO2 100 %.   Physical Exam Nursing note reviewed.  Constitutional:       General: He is not in acute distress.    Appearance: Normal appearance. He is not ill-appearing or diaphoretic.  HENT:     Head: Normocephalic and atraumatic.     Comments: Cap and mask.    Mouth/Throat:     Mouth: Mucous membranes are moist.     Pharynx: Oropharynx is clear. No oropharyngeal exudate or posterior oropharyngeal erythema.  Eyes:     General: No scleral icterus.    Extraocular Movements: Extraocular movements intact.     Conjunctiva/sclera: Conjunctivae normal.     Pupils: Pupils are equal, round, and reactive to light.     Comments: Bilateral arcus.  Cardiovascular:     Rate and Rhythm: Normal rate and regular rhythm.     Heart sounds: Normal heart sounds.  Pulmonary:     Breath sounds: Normal breath sounds.  Abdominal:  General: There is no distension.     Palpations: Abdomen is soft. There is no mass.     Tenderness: There is no abdominal tenderness. There is no guarding or rebound.  Musculoskeletal:        General: No swelling, tenderness, deformity or signs of injury.     Cervical back: Normal range of motion and neck supple.  Lymphadenopathy:     Cervical: No cervical adenopathy.  Skin:    General: Skin is warm.     Coloration: Skin is not jaundiced or pale.     Findings: No erythema.  Neurological:     Mental Status: He is alert and oriented to person, place, and time.  Psychiatric:        Behavior: Behavior normal.        Thought Content: Thought content normal.        Judgment: Judgment normal.     Appointment on 05/21/2020  Component Date Value Ref Range Status  . WBC 05/21/2020 6.4  4.0 - 10.5 K/uL Final  . RBC 05/21/2020 4.32  4.22 - 5.81 MIL/uL Final  . Hemoglobin 05/21/2020 13.3  13.0 - 17.0 g/dL Final  . HCT 05/21/2020 40.2  39 - 52 % Final  . MCV 05/21/2020 93.1  80.0 - 100.0 fL Final  . MCH 05/21/2020 30.8  26.0 - 34.0 pg Final  . MCHC 05/21/2020 33.1  30.0 - 36.0 g/dL Final  . RDW 05/21/2020 13.3  11.5 - 15.5 % Final  . Platelets  05/21/2020 119* 150 - 400 K/uL Final  . nRBC 05/21/2020 0.0  0.0 - 0.2 % Final  . Neutrophils Relative % 05/21/2020 62  % Final  . Neutro Abs 05/21/2020 4.0  1.7 - 7.7 K/uL Final  . Lymphocytes Relative 05/21/2020 26  % Final  . Lymphs Abs 05/21/2020 1.7  0.7 - 4.0 K/uL Final  . Monocytes Relative 05/21/2020 9  % Final  . Monocytes Absolute 05/21/2020 0.6  0 - 1 K/uL Final  . Eosinophils Relative 05/21/2020 2  % Final  . Eosinophils Absolute 05/21/2020 0.1  0 - 0 K/uL Final  . Basophils Relative 05/21/2020 1  % Final  . Basophils Absolute 05/21/2020 0.1  0 - 0 K/uL Final  . Immature Granulocytes 05/21/2020 0  % Final  . Abs Immature Granulocytes 05/21/2020 0.02  0.00 - 0.07 K/uL Final   Performed at [facility], [address], [place], [place] [address]  . Sodium 05/21/2020 141  135 - 145 mmol/L Final  . Potassium 05/21/2020 4.0  3.5 - 5.1 mmol/L Final  . Chloride 05/21/2020 105  98 - 111 mmol/L Final  . CO2 05/21/2020 26  22 - 32 mmol/L Final  . Glucose, Bld 05/21/2020 131* 70 - 99 mg/dL Final   Glucose reference range applies only to samples taken after fasting for at least 8 hours.  . BUN 05/21/2020 11  8 - 23 mg/dL Final  . Creatinine, Ser 05/21/2020 1.08  0.61 - 1.24 mg/dL Final  . Calcium 05/21/2020 9.0  8.9 - 10.3 mg/dL Final  . Total Protein 05/21/2020 6.9  6.5 - 8.1 g/dL Final  . Albumin 05/21/2020 3.9  3.5 - 5.0 g/dL Final  . AST 05/21/2020 16  15 - 41 U/L Final  . ALT 05/21/2020 13  0 - 44 U/L Final  . Alkaline Phosphatase 05/21/2020 60  38 - 126 U/L Final  . Total Bilirubin 05/21/2020 0.7  0.3 - 1.2 mg/dL Final  . GFR calc non  Af Amer 05/21/2020 >60  >60 mL/min Final  . GFR calc Af Amer 05/21/2020 >60  >60 mL/min Final  . Anion gap 05/21/2020 10  5 - 15 Final   Performed at [facility], [address], [place], [place] [address]    Assessment:  Verna Hamon is a 86 y.o. male withiron deficiencyand an IgA monoclonal gammopathy.  Hemoglobinhas ranged from 11.2 - 11.5.  [place] labs from 09/26/2018 - 03/05/2020revealed following normal studies: B12, LDH, [place]. MMA was 17.8 (0.3 - 2.8).  Work-up on 07/04/2019:hematocrit 39.1, hemoglobin 12.2, MCV 89.7, platelets 137,000, WBC 7,400 with a normal differential.Normal studiesincluded: creatinine (1.04), calcium (9.1), LFTs, haptoglobin, LDH, folate, and TSH. Ferritinwas 12 with an iron saturation 15%and a TIBC of 395.SPEPrevealed a 0.4gm/dL IgA monoclonal protein with kappa light chain specificity.IgG was878, IgA 527(61-437),andIgM 44.  Kappa free light chains 74, lambda light chains 25.3 and ratio 2.92 (0.26 - 1.65) on 07/25/2019.  Beta-2 microglobulin was 2.0 (normal).  24-hour UPEP revealed no monoclonal protein with kappa/lambda free light chain ratio of > 15.72 (1.03 - 31.76).  SPEP has been followed (gm/dL): 0.4 on 07/04/2019, 0.1 on 11/11/2019, and 0.4 on 05/21/2020.  He has a history of a diverticularbleed3 years ago necessitating admission. He had an EGD and colonoscopyin [place] about 2 years ago (no report available). Per patient,there were polyps andno evidence ofulcer orgastritis. He has not undergone a capsule study.Guaiac cardswere negative on 01/06/2018. Urinalysiswas negative for blood on 12/12/2017.   He has iron deficiency.  Ferritin was 12 on 07/04/2019, 20 on 08/21/2019, and 26 on 11/21/2019.  He is on oral B12.  The patient received his COVID-19 vaccines on 01/18/2020 and in 01/2020  Symptomatically, he notes dark stool on oral iron.  Exam is unremarkable.  Plan: 1.   Labs today: CBC with diff, CMP, SPEP, ferritin, iron studies. 2.   Monoclonal gammopathy of unknown significance (MGUS) He has an IgA monoclonal gammopathy. M-spike is 0.4 gm/dL (slight increase).             UPEP revealed no monoclonal protein.             He has no CRAB criteria: Hypercalcemia, renal  dysfunction, anemia and bone lesions. Continue to monitor every 6 months. 3. Iron deficiencyanemia Hemoglobin 12.2. MCV 89.7 on 07/04/2019.   Ferritin 12.   Hemoglobin 13.8.  MCV 92.1 on 11/21/2019.   Ferritin 26.  Hemoglobin 13.3.  MCV 93.1 on 05/21/2020.   Ferritin 34. He has a history of lower GI bleed and subsequent iron deficiency. He denies any bleeding but notes dark stools on oral iron. Continue oral iron with vitamin C.         Ferritin goal is 100.    His last colonoscopy was years ago and he has not followed up with GI. 4.   GI consult- rectal incontinence and iron deficiency. 5.   RTC in 3 months for labs (CBC, ferritin). 6.   RTC in 6 months for MD assessment and labs (CBC with diff, CMP, SPEP, ferritin, iron studies).  I discussed the assessment and treatment plan with the patient.  The patient was provided an opportunity to ask questions and all were answered.  The patient agreed with the plan and demonstrated an understanding of the instructions.  The patient was advised to call back if the symptoms worsen or if the condition fails to improve as anticipated.   Lequita Asal, MD, PhD    05/21/2020, 9:43 AM  I,  Mirian Mo Tufford, am acting as a Education administrator for Lequita Asal, MD.  I, [place] Mike Gip, MD, have reviewed the above documentation for accuracy and completeness, and I agree with the above.

## 2020-05-20 NOTE — Progress Notes (Signed)
Patient is unsure of what medications he is taking. Unable to do review. He states he will bring a list or family member with him tomorrow. He is having back pain (5/10). On Sunday he missed church because he had bad diarrhea.

## 2020-05-21 ENCOUNTER — Encounter: Payer: Self-pay | Admitting: Hematology and Oncology

## 2020-05-21 ENCOUNTER — Other Ambulatory Visit: Payer: Self-pay

## 2020-05-21 ENCOUNTER — Inpatient Hospital Stay: Payer: Medicare HMO | Attending: Hematology and Oncology

## 2020-05-21 ENCOUNTER — Inpatient Hospital Stay (HOSPITAL_BASED_OUTPATIENT_CLINIC_OR_DEPARTMENT_OTHER): Payer: Medicare HMO | Admitting: Hematology and Oncology

## 2020-05-21 VITALS — BP 126/78 | HR 73 | Temp 97.2°F | Resp 18 | Wt 157.6 lb

## 2020-05-21 DIAGNOSIS — E7800 Pure hypercholesterolemia, unspecified: Secondary | ICD-10-CM | POA: Insufficient documentation

## 2020-05-21 DIAGNOSIS — R159 Full incontinence of feces: Secondary | ICD-10-CM | POA: Diagnosis not present

## 2020-05-21 DIAGNOSIS — Z791 Long term (current) use of non-steroidal anti-inflammatories (NSAID): Secondary | ICD-10-CM | POA: Insufficient documentation

## 2020-05-21 DIAGNOSIS — Z79899 Other long term (current) drug therapy: Secondary | ICD-10-CM | POA: Insufficient documentation

## 2020-05-21 DIAGNOSIS — Z8249 Family history of ischemic heart disease and other diseases of the circulatory system: Secondary | ICD-10-CM | POA: Diagnosis not present

## 2020-05-21 DIAGNOSIS — E119 Type 2 diabetes mellitus without complications: Secondary | ICD-10-CM | POA: Diagnosis not present

## 2020-05-21 DIAGNOSIS — Z7982 Long term (current) use of aspirin: Secondary | ICD-10-CM | POA: Diagnosis not present

## 2020-05-21 DIAGNOSIS — Z809 Family history of malignant neoplasm, unspecified: Secondary | ICD-10-CM | POA: Diagnosis not present

## 2020-05-21 DIAGNOSIS — Z87891 Personal history of nicotine dependence: Secondary | ICD-10-CM | POA: Diagnosis not present

## 2020-05-21 DIAGNOSIS — Z833 Family history of diabetes mellitus: Secondary | ICD-10-CM | POA: Insufficient documentation

## 2020-05-21 DIAGNOSIS — Z7984 Long term (current) use of oral hypoglycemic drugs: Secondary | ICD-10-CM | POA: Diagnosis not present

## 2020-05-21 DIAGNOSIS — D472 Monoclonal gammopathy: Secondary | ICD-10-CM | POA: Diagnosis not present

## 2020-05-21 DIAGNOSIS — D509 Iron deficiency anemia, unspecified: Secondary | ICD-10-CM

## 2020-05-21 DIAGNOSIS — K219 Gastro-esophageal reflux disease without esophagitis: Secondary | ICD-10-CM | POA: Insufficient documentation

## 2020-05-21 DIAGNOSIS — I10 Essential (primary) hypertension: Secondary | ICD-10-CM | POA: Diagnosis not present

## 2020-05-21 LAB — CBC WITH DIFFERENTIAL/PLATELET
Abs Immature Granulocytes: 0.02 10*3/uL (ref 0.00–0.07)
Basophils Absolute: 0.1 10*3/uL (ref 0.0–0.1)
Basophils Relative: 1 %
Eosinophils Absolute: 0.1 10*3/uL (ref 0.0–0.5)
Eosinophils Relative: 2 %
HCT: 40.2 % (ref 39.0–52.0)
Hemoglobin: 13.3 g/dL (ref 13.0–17.0)
Immature Granulocytes: 0 %
Lymphocytes Relative: 26 %
Lymphs Abs: 1.7 10*3/uL (ref 0.7–4.0)
MCH: 30.8 pg (ref 26.0–34.0)
MCHC: 33.1 g/dL (ref 30.0–36.0)
MCV: 93.1 fL (ref 80.0–100.0)
Monocytes Absolute: 0.6 10*3/uL (ref 0.1–1.0)
Monocytes Relative: 9 %
Neutro Abs: 4 10*3/uL (ref 1.7–7.7)
Neutrophils Relative %: 62 %
Platelets: 119 10*3/uL — ABNORMAL LOW (ref 150–400)
RBC: 4.32 MIL/uL (ref 4.22–5.81)
RDW: 13.3 % (ref 11.5–15.5)
WBC: 6.4 10*3/uL (ref 4.0–10.5)
nRBC: 0 % (ref 0.0–0.2)

## 2020-05-21 LAB — COMPREHENSIVE METABOLIC PANEL
ALT: 13 U/L (ref 0–44)
AST: 16 U/L (ref 15–41)
Albumin: 3.9 g/dL (ref 3.5–5.0)
Alkaline Phosphatase: 60 U/L (ref 38–126)
Anion gap: 10 (ref 5–15)
BUN: 11 mg/dL (ref 8–23)
CO2: 26 mmol/L (ref 22–32)
Calcium: 9 mg/dL (ref 8.9–10.3)
Chloride: 105 mmol/L (ref 98–111)
Creatinine, Ser: 1.08 mg/dL (ref 0.61–1.24)
GFR calc Af Amer: >60 mL/min (ref >60)
GFR calc non Af Amer: >60 mL/min (ref >60)
Glucose, Bld: 131 mg/dL — ABNORMAL HIGH (ref 70–99)
Potassium: 4 mmol/L (ref 3.5–5.1)
Sodium: 141 mmol/L (ref 135–145)
Total Bilirubin: 0.7 mg/dL (ref 0.3–1.2)
Total Protein: 6.9 g/dL (ref 6.5–8.1)

## 2020-05-21 LAB — FERRITIN: Ferritin: 34 ng/mL (ref 24–336)

## 2020-05-21 LAB — IRON AND TIBC
Iron: 54 ug/dL (ref 45–182)
Saturation Ratios: 17 % — ABNORMAL LOW (ref 17.9–39.5)
TIBC: 314 ug/dL (ref 250–450)
UIBC: 260 ug/dL

## 2020-05-22 LAB — PROTEIN ELECTROPHORESIS, SERUM
A/G Ratio: 1.3 (ref 0.7–1.7)
Albumin ELP: 3.6 g/dL (ref 2.9–4.4)
Alpha-1-Globulin: 0.2 g/dL (ref 0.0–0.4)
Alpha-2-Globulin: 0.7 g/dL (ref 0.4–1.0)
Beta Globulin: 1.2 g/dL (ref 0.7–1.3)
Gamma Globulin: 0.7 g/dL (ref 0.4–1.8)
Globulin, Total: 2.7 g/dL (ref 2.2–3.9)
M-Spike, %: 0.4 g/dL — ABNORMAL HIGH
Total Protein ELP: 6.3 g/dL (ref 6.0–8.5)

## 2020-07-21 ENCOUNTER — Telehealth: Payer: Self-pay

## 2020-07-21 NOTE — Telephone Encounter (Signed)
Opened encounter in error  

## 2020-07-23 ENCOUNTER — Encounter: Payer: Self-pay | Admitting: Gastroenterology

## 2020-07-23 ENCOUNTER — Other Ambulatory Visit: Payer: Self-pay

## 2020-07-23 ENCOUNTER — Ambulatory Visit (INDEPENDENT_AMBULATORY_CARE_PROVIDER_SITE_OTHER): Payer: Medicare HMO | Admitting: Gastroenterology

## 2020-07-23 VITALS — BP 115/66 | HR 71 | Temp 97.7°F | Ht 68.0 in | Wt 156.6 lb

## 2020-07-23 DIAGNOSIS — D500 Iron deficiency anemia secondary to blood loss (chronic): Secondary | ICD-10-CM

## 2020-07-23 DIAGNOSIS — R159 Full incontinence of feces: Secondary | ICD-10-CM

## 2020-07-23 MED ORDER — SUPREP BOWEL PREP KIT 17.5-3.13-1.6 GM/177ML PO SOLN: 1.0000 | ORAL | 0 refills | Status: DC

## 2020-07-23 NOTE — Progress Notes (Signed)
Gastroenterology Consultation  Referring Provider:     Weber Cooks, MD Primary Care Physician:  Weber Cooks, MD Primary Gastroenterologist:  Dr. Allen Norris     Reason for Consultation:     Iron deficiency anemia and stool incontinence        HPI:   Ryan Serrano is a 86 y.o. y/o male referred for consultation & management of Iron deficiency anemia and stool incontinence by Dr. Weber Cooks, MD.  This patient comes in today after seeing hematology with a history of Monoclonal gammopathy. The patient has been found to have iron deficiency anemia and was recommended to have a GI evaluation.  The patient has also had some stool incontinence and he comes with his son today who reports that the patient has been eating a lot of fruits.  He denies any excessive dairy intake but does have cheese.  The patient denies any abdominal pain nausea vomiting fevers or chills.  The patient states that he has had a colonoscopy in the past but cannot remember when he had it.  He denies any black stools or bloody stools.  He does endorse frequent diarrhea.  The patient's iron studies have shown a hemoglobin between 12.2 and 13.3 with a ferritin ranging from 12-34 being on iron.  The patient does report that he has a history of a lower GI bleed in the past.  Past Medical History:  Diagnosis Date  . Diabetes mellitus without complication ([place])   . GERD (gastroesophageal reflux disease)   . High cholesterol   . Hypertension   . Kidney stones     Past Surgical History:  Procedure Laterality Date  . ESOPHAGUS SURGERY    . FINGER AMPUTATION Left   . HEMORRHOID BANDING    . HERNIA REPAIR    . KIDNEY STONE SURGERY      Prior to Admission medications   Medication Sig Start Date End Date Taking? Authorizing Provider  acetaminophen (ACETAMINOPHEN 8 HOUR) 650 MG CR tablet Take 1,300 mg by mouth 2 (two) times daily.   Yes [provider]  amLODipine (NORVASC) 5 MG tablet Take 5 mg by mouth daily.  01/17/17   Yes [provider]  aspirin EC 81 MG tablet Take 81 mg by mouth daily.    Yes [provider]  Blood Glucose Monitoring Suppl (TRUE METRIX METER) w/Device KIT  07/14/20  Yes [provider]  diclofenac sodium (VOLTAREN) 1 % GEL Apply 2 g topically as needed.  03/08/19  Yes [provider]  ferrous gluconate (IRON 27) 240 (27 FE) MG tablet  04/25/19  Yes [provider]  fluticasone (FLONASE) 50 MCG/ACT nasal spray 1 spray by Each Nare route daily.   Yes [provider]  gabapentin (NEURONTIN) 300 MG capsule 600 mg Three (3) times a day. 06/20/13  Yes [provider]  lisinopril (ZESTRIL) 40 MG tablet Take 40 mg by mouth daily.  01/20/17  Yes [provider]  metFORMIN (GLUCOPHAGE-XR) 500 MG 24 hr tablet Take 500 mg by mouth 2 (two) times daily.  05/04/19  Yes [provider]  pantoprazole (PROTONIX) 40 MG tablet  05/28/20  Yes [provider]  simvastatin (ZOCOR) 20 MG tablet Take 20 mg by mouth daily at 6 PM.  01/12/17  Yes [provider]  tamsulosin (FLOMAX) 0.4 MG CAPS capsule Take 0.4 mg by mouth daily.  02/04/17  Yes [provider]  TRUE METRIX BLOOD GLUCOSE TEST test strip  07/14/20  Yes [provider]  vitamin B-12 (CYANOCOBALAMIN) 1000 MCG tablet Take 1,000 mcg by mouth daily.   Yes [provider]  [facility] Thin Lancets MISC Apply 1 each topically daily. 07/14/20  Yes [provider]    Family History  Problem Relation Age of Onset  . Hypertension Mother   . Cancer Father   . Diabetes Father   . Hypertension Father      Social History   Tobacco Use  . Smoking status: Former Smoker    Years: 16.00    Types: Cigarettes    Quit date: 1980    Years since quitting: 41.6  . Smokeless tobacco: Current User    Types: Chew  Vaping Use  . Vaping Use: Never used  Substance Use Topics  . Alcohol use: Never  . Drug use: Never    Allergies as of 07/23/2020  - Review Complete 07/23/2020  Allergen Reaction Noted  . Hydrochlorothiazide  08/22/2013  . Penicillins Anaphylaxis and Other (See Comments) 04/06/2013  . Atorvastatin Other (See Comments) 11/30/2006  . Bee venom Other (See Comments) 11/27/2013  . Hydrocodone Other (See Comments) 11/27/2013  . Hydrocodone-acetaminophen Other (See Comments) 06/12/2013    Review of Systems:    All systems reviewed and negative except where noted in HPI.   Physical Exam:  BP 115/66   Pulse 71   Temp 97.7 F (36.5 C) (Temporal)   Ht 5' 8"  (1.727 m)   Wt 156 lb 9.6 oz (71 kg)   BMI 23.81 kg/m  No LMP for male patient. General:   Alert,  Well-developed, well-nourished, pleasant and cooperative in NAD Head:  Normocephalic and atraumatic. Eyes:  Sclera clear, no icterus.   Conjunctiva pink. Ears:  Normal auditory acuity. Neck:  Supple; no masses or thyromegaly. Lungs:  Respirations even and unlabored.  Clear throughout to auscultation.   No wheezes, crackles, or rhonchi. No acute distress. Heart:  Regular rate and rhythm; no murmurs, clicks, rubs, or gallops. Abdomen:  Normal bowel sounds.  No bruits.  Soft, non-tender and non-distended without masses, hepatosplenomegaly or hernias noted.  No guarding or rebound tenderness.  Negative Carnett sign.   Rectal:  Deferred.  Pulses:  Normal pulses noted. Extremities:  No clubbing or edema.  No cyanosis. Neurologic:  Alert and oriented x3;  grossly normal neurologically. Skin:  Intact without significant lesions or rashes.  No jaundice. Lymph Nodes:  No significant cervical adenopathy. Psych:  Alert and cooperative. Normal mood and affect.  Imaging Studies: No results found.  Assessment and Plan:   Ryan Serrano is a 86 y.o. y/o male Who comes in with a low ferritin and iron deficiency anemia with a history of a lower GI bleed in the past.  The patient has not had a colonoscopy in many years.  The patient has been told that he should undergo an EGD and  colonoscopy despite his advanced age the patient looks very healthy and fit.  He also has been told to try and cut back on his foods for a week and see if his stools become more firm and his incontinence resolved.  The patient will follow up at the time of his EGD and colonoscopy.  The patient has been explained the plan and agrees with it.    Lucilla Lame, MD. Marval Regal    Note: This dictation was prepared with Dragon dictation along with smaller phrase technology. Any transcriptional errors that result from this process are unintentional.

## 2020-07-29 ENCOUNTER — Other Ambulatory Visit: Payer: Self-pay

## 2020-07-29 ENCOUNTER — Encounter: Payer: Self-pay | Admitting: Gastroenterology

## 2020-07-31 ENCOUNTER — Other Ambulatory Visit
Admission: RE | Admit: 2020-07-31 | Discharge: 2020-07-31 | Disposition: A | Discharge status: Home / Self Care | Payer: Medicare HMO | Source: Ambulatory Visit | Attending: Gastroenterology | Admitting: Gastroenterology

## 2020-07-31 ENCOUNTER — Other Ambulatory Visit: Payer: Self-pay

## 2020-07-31 DIAGNOSIS — Z01818 Encounter for other preprocedural examination: Secondary | ICD-10-CM | POA: Insufficient documentation

## 2020-07-31 DIAGNOSIS — Z20822 Contact with and (suspected) exposure to covid-19: Secondary | ICD-10-CM | POA: Insufficient documentation

## 2020-08-01 LAB — SARS CORONAVIRUS 2 (TAT 6-24 HRS): SARS Coronavirus 2: NEGATIVE

## 2020-08-01 NOTE — Discharge Instructions (Signed)
General Anesthesia, Adult, Care After This sheet gives you information about how to care for yourself after your procedure. Your health care provider may also give you more specific instructions. If you have problems or questions, contact your health care provider. What can I expect after the procedure? After the procedure, the following side effects are common:  Pain or discomfort at the IV site.  Nausea.  Vomiting.  Sore throat.  Trouble concentrating.  Feeling cold or chills.  Weak or tired.  Sleepiness and fatigue.  Soreness and body aches. These side effects can affect parts of the body that were not involved in surgery. Follow these instructions at home:  For at least 24 hours after the procedure:  Have a responsible adult stay with you. It is important to have someone help care for you until you are awake and alert.  Rest as needed.  Do not: ? Participate in activities in which you could fall or become injured. ? Drive. ? Use heavy machinery. ? Drink alcohol. ? Take sleeping pills or medicines that cause drowsiness. ? Make important decisions or sign legal documents. ? Take care of children on your own. Eating and drinking  Follow any instructions from your health care provider about eating or drinking restrictions.  When you feel hungry, start by eating small amounts of foods that are soft and easy to digest (bland), such as toast. Gradually return to your regular diet.  Drink enough fluid to keep your urine pale yellow.  If you vomit, rehydrate by drinking water, juice, or clear broth. General instructions  If you have sleep apnea, surgery and certain medicines can increase your risk for breathing problems. Follow instructions from your health care provider about wearing your sleep device: ? Anytime you are sleeping, including during daytime naps. ? While taking prescription pain medicines, sleeping medicines, or medicines that make you drowsy.  Return to  your normal activities as told by your health care provider. Ask your health care provider what activities are safe for you.  Take over-the-counter and prescription medicines only as told by your health care provider.  If you smoke, do not smoke without supervision.  Keep all follow-up visits as told by your health care provider. This is important. Contact a health care provider if:  You have nausea or vomiting that does not get better with medicine.  You cannot eat or drink without vomiting.  You have pain that does not get better with medicine.  You are unable to pass urine.  You develop a skin rash.  You have a fever.  You have redness around your IV site that gets worse. Get help right away if:  You have difficulty breathing.  You have chest pain.  You have blood in your urine or stool, or you vomit blood. Summary  After the procedure, it is common to have a sore throat or nausea. It is also common to feel tired.  Have a responsible adult stay with you for the first 24 hours after general anesthesia. It is important to have someone help care for you until you are awake and alert.  When you feel hungry, start by eating small amounts of foods that are soft and easy to digest (bland), such as toast. Gradually return to your regular diet.  Drink enough fluid to keep your urine pale yellow.  Return to your normal activities as told by your health care provider. Ask your health care provider what activities are safe for you. This information is not   intended to replace advice given to you by your health care provider. Make sure you discuss any questions you have with your health care provider. Document Revised: 11/11/2017 Document Reviewed: 06/24/2017 Elsevier Patient Education  2020 [employer]  

## 2020-08-04 ENCOUNTER — Encounter
Admission: RE | Disposition: A | Discharge status: Home / Self Care | Payer: Self-pay | Source: Home / Self Care | Attending: Gastroenterology

## 2020-08-04 ENCOUNTER — Ambulatory Visit
Admission: RE | Admit: 2020-08-04 | Discharge: 2020-08-04 | Disposition: A | Discharge status: Home / Self Care | Payer: Medicare HMO | Attending: Gastroenterology | Admitting: Gastroenterology

## 2020-08-04 ENCOUNTER — Other Ambulatory Visit: Payer: Self-pay

## 2020-08-04 ENCOUNTER — Encounter: Payer: Self-pay | Admitting: Gastroenterology

## 2020-08-04 ENCOUNTER — Ambulatory Visit: Payer: Medicare HMO | Admitting: Anesthesiology

## 2020-08-04 DIAGNOSIS — E119 Type 2 diabetes mellitus without complications: Secondary | ICD-10-CM | POA: Insufficient documentation

## 2020-08-04 DIAGNOSIS — E7800 Pure hypercholesterolemia, unspecified: Secondary | ICD-10-CM | POA: Diagnosis not present

## 2020-08-04 DIAGNOSIS — K449 Diaphragmatic hernia without obstruction or gangrene: Secondary | ICD-10-CM | POA: Insufficient documentation

## 2020-08-04 DIAGNOSIS — K5730 Diverticulosis of large intestine without perforation or abscess without bleeding: Secondary | ICD-10-CM | POA: Diagnosis not present

## 2020-08-04 DIAGNOSIS — D509 Iron deficiency anemia, unspecified: Secondary | ICD-10-CM | POA: Insufficient documentation

## 2020-08-04 DIAGNOSIS — I10 Essential (primary) hypertension: Secondary | ICD-10-CM | POA: Diagnosis not present

## 2020-08-04 DIAGNOSIS — Z8249 Family history of ischemic heart disease and other diseases of the circulatory system: Secondary | ICD-10-CM | POA: Insufficient documentation

## 2020-08-04 DIAGNOSIS — K641 Second degree hemorrhoids: Secondary | ICD-10-CM | POA: Insufficient documentation

## 2020-08-04 DIAGNOSIS — K317 Polyp of stomach and duodenum: Secondary | ICD-10-CM | POA: Diagnosis not present

## 2020-08-04 DIAGNOSIS — D500 Iron deficiency anemia secondary to blood loss (chronic): Secondary | ICD-10-CM

## 2020-08-04 DIAGNOSIS — Z720 Tobacco use: Secondary | ICD-10-CM | POA: Insufficient documentation

## 2020-08-04 DIAGNOSIS — Z7982 Long term (current) use of aspirin: Secondary | ICD-10-CM | POA: Insufficient documentation

## 2020-08-04 DIAGNOSIS — Z79899 Other long term (current) drug therapy: Secondary | ICD-10-CM | POA: Diagnosis not present

## 2020-08-04 DIAGNOSIS — Z8673 Personal history of transient ischemic attack (TIA), and cerebral infarction without residual deficits: Secondary | ICD-10-CM | POA: Insufficient documentation

## 2020-08-04 DIAGNOSIS — K219 Gastro-esophageal reflux disease without esophagitis: Secondary | ICD-10-CM | POA: Diagnosis not present

## 2020-08-04 DIAGNOSIS — Z833 Family history of diabetes mellitus: Secondary | ICD-10-CM | POA: Insufficient documentation

## 2020-08-04 HISTORY — DX: Cardiac murmur, unspecified: R01.1

## 2020-08-04 HISTORY — PX: BIOPSY: SHX5522

## 2020-08-04 HISTORY — PX: ESOPHAGOGASTRODUODENOSCOPY (EGD) WITH PROPOFOL: SHX5813

## 2020-08-04 HISTORY — PX: COLONOSCOPY WITH PROPOFOL: SHX5780

## 2020-08-04 HISTORY — DX: Presence of dental prosthetic device (complete) (partial): Z97.2

## 2020-08-04 LAB — GLUCOSE, CAPILLARY
Glucose-Capillary: 121 mg/dL — ABNORMAL HIGH (ref 70–99)
Glucose-Capillary: 77 mg/dL (ref 70–99)

## 2020-08-04 SURGERY — COLONOSCOPY WITH PROPOFOL
Anesthesia: General | Site: Esophagus

## 2020-08-04 MED ORDER — LIDOCAINE HCL (CARDIAC) PF 100 MG/5ML IV SOSY
PREFILLED_SYRINGE | INTRAVENOUS | Status: DC | PRN
  Administered 2020-08-04: 30 mg via INTRAVENOUS

## 2020-08-04 MED ORDER — STERILE WATER FOR IRRIGATION IR SOLN: Status: DC | PRN

## 2020-08-04 MED ORDER — LACTATED RINGERS IV SOLN: INTRAVENOUS | Status: DC

## 2020-08-04 MED ORDER — PROPOFOL 10 MG/ML IV BOLUS
INTRAVENOUS | Status: DC | PRN
  Administered 2020-08-04 (×2): 20 mg via INTRAVENOUS
  Administered 2020-08-04: 100 mg via INTRAVENOUS
  Administered 2020-08-04 (×6): 20 mg via INTRAVENOUS

## 2020-08-04 SURGICAL SUPPLY — 36 items
BALLN DILATOR 10-12 8 (BALLOONS)
BALLN DILATOR 12-15 8 (BALLOONS)
BALLN DILATOR 15-18 8 (BALLOONS)
BALLN DILATOR CRE 0-12 8 (BALLOONS)
BALLN DILATOR ESOPH 8 10 CRE (MISCELLANEOUS) IMPLANT
BALLOON DILATOR 12-15 8 (BALLOONS) IMPLANT
BALLOON DILATOR 15-18 8 (BALLOONS) IMPLANT
BALLOON DILATOR CRE 0-12 8 (BALLOONS) IMPLANT
BLOCK BITE 60FR ADLT L/F GRN (MISCELLANEOUS) ×4 IMPLANT
CLIP HMST 235XBRD CATH ROT (MISCELLANEOUS) IMPLANT
CLIP RESOLUTION 360 11X235 (MISCELLANEOUS)
ELECT REM PT RETURN 9FT ADLT (ELECTROSURGICAL)
ELECTRODE REM PT RTRN 9FT ADLT (ELECTROSURGICAL) IMPLANT
FCP ESCP3.2XJMB 240X2.8X (MISCELLANEOUS) ×2
FORCEPS BIOP RAD 4 LRG CAP 4 (CUTTING FORCEPS) IMPLANT
FORCEPS BIOP RJ4 240 W/NDL (MISCELLANEOUS) ×4
FORCEPS ESCP3.2XJMB 240X2.8X (MISCELLANEOUS) ×2 IMPLANT
GOWN CVR UNV OPN BCK APRN NK (MISCELLANEOUS) ×4 IMPLANT
GOWN ISOL THUMB LOOP REG UNIV (MISCELLANEOUS) ×8
INJECTOR VARIJECT VIN23 (MISCELLANEOUS) IMPLANT
KIT DEFENDO VALVE AND CONN (KITS) IMPLANT
KIT ENDO PROCEDURE OLY (KITS) ×4 IMPLANT
MANIFOLD NEPTUNE II (INSTRUMENTS) ×4 IMPLANT
MARKER SPOT ENDO TATTOO 5ML (MISCELLANEOUS) IMPLANT
PROBE APC STR FIRE (PROBE) IMPLANT
RETRIEVER NET PLAT FOOD (MISCELLANEOUS) IMPLANT
RETRIEVER NET ROTH 2.5X230 LF (MISCELLANEOUS) IMPLANT
SNARE SHORT THROW 13M SML OVAL (MISCELLANEOUS) IMPLANT
SNARE SHORT THROW 30M LRG OVAL (MISCELLANEOUS) IMPLANT
SNARE SNG USE RND 15MM (INSTRUMENTS) IMPLANT
SPOT EX ENDOSCOPIC TATTOO (MISCELLANEOUS)
SYR INFLATION 60ML (SYRINGE) IMPLANT
TRAP ETRAP POLY (MISCELLANEOUS) IMPLANT
VARIJECT INJECTOR VIN23 (MISCELLANEOUS)
WATER STERILE IRR 250ML POUR (IV SOLUTION) ×4 IMPLANT
WIRE CRE 18-20MM 8CM F G (MISCELLANEOUS) IMPLANT

## 2020-08-04 NOTE — Transfer of Care (Signed)
Immediate Anesthesia Transfer of Care Note  Patient: Ryan Serrano  Procedure(s) Performed: COLONOSCOPY WITH PROPOFOL (N/A ) ESOPHAGOGASTRODUODENOSCOPY (EGD) WITH PROPOFOL (N/A ) BIOPSY (N/A Esophagus)  Patient Location: PACU  Anesthesia Type: General  Level of Consciousness: awake, alert  and patient cooperative  Airway and Oxygen Therapy: Patient Spontanous Breathing and Patient connected to supplemental oxygen  Post-op Assessment: Post-op Vital signs reviewed, Patient's Cardiovascular Status Stable, Respiratory Function Stable, Patent Airway and No signs of Nausea or vomiting  Post-op Vital Signs: Reviewed and stable  Complications: No complications documented.

## 2020-08-04 NOTE — Anesthesia Procedure Notes (Signed)
Date/Time: 08/04/2020 8:02 AM Performed by: Cameron Ali, CRNA Pre-anesthesia Checklist: Patient identified, Emergency Drugs available, Suction available, Timeout performed and Patient being monitored Patient Re-evaluated:Patient Re-evaluated prior to induction Oxygen Delivery Method: Nasal cannula Placement Confirmation: positive ETCO2

## 2020-08-04 NOTE — Op Note (Signed)
[facility] Gastroenterology Patient Name: Ryan Serrano Procedure Date: 08/04/2020 7:40 AM MRN: [id number] Account #: [id number] Date of Birth: 1933-09-19 Admit Type: Outpatient Age: 86 Room: [facility] OR ROOM 01 Gender: Male Note Status: Finalized Procedure:             Upper GI endoscopy Indications:           Iron deficiency anemia Providers:             Lucilla Lame MD, MD Medicines:             Propofol per Anesthesia Complications:         No immediate complications. Procedure:             Pre-Anesthesia Assessment:                        - Prior to the procedure, a History and Physical was                         performed, and patient medications and allergies were                         reviewed. The patient's tolerance of previous                         anesthesia was also reviewed. The risks and benefits                         of the procedure and the sedation options and risks                         were discussed with the patient. All questions were                         answered, and informed consent was obtained. Prior                         Anticoagulants: The patient has taken no previous                         anticoagulant or antiplatelet agents. ASA Grade                         Assessment: II - A patient with mild systemic disease.                         After reviewing the risks and benefits, the patient                         was deemed in satisfactory condition to undergo the                         procedure.                        After obtaining informed consent, the endoscope was                         passed under direct vision. Throughout the procedure,  the patient's blood pressure, pulse, and oxygen                         saturations were monitored continuously. The                         Endosonoscope was introduced through the mouth, and                         advanced to the second part of  duodenum. The upper GI                         endoscopy was accomplished without difficulty. The                         patient tolerated the procedure well. Findings:      A small hiatal hernia was present.      A single 4 mm sessile polyp with no bleeding and no stigmata of recent       bleeding was found in the gastric body. The polyp was removed with a       cold biopsy forceps. Resection and retrieval were complete.      The examined duodenum was normal. Impression:            - Small hiatal hernia.                        - A single gastric polyp. Resected and retrieved.                        - Normal examined duodenum. Recommendation:        - Discharge patient to home.                        - Resume previous diet.                        - Continue present medications.                        - Await pathology results. Procedure Code(s):     --- Professional ---                        [id number], Esophagogastroduodenoscopy, flexible,                         transoral; with biopsy, single or multiple Diagnosis Code(s):     --- Professional ---                        D50.9, Iron deficiency anemia, unspecified                        K31.7, Polyp of stomach and duodenum CPT copyright 2019 American Medical Association. All rights reserved. The codes documented in this report are preliminary and upon coder review may  be revised to meet current compliance requirements. Lucilla Lame MD, MD 08/04/2020 8:13:42 AM This report has been signed electronically. Number of Addenda: 0 Note Initiated On: 08/04/2020 7:40 AM Total Procedure Duration: 0 hours 4 minutes 45 seconds  Estimated Blood Loss:  Estimated blood loss: none.      [facility]

## 2020-08-04 NOTE — H&P (Addendum)
Lucilla Lame, MD [facility] [address], [place], [place] [address] Phone:[phone] Fax : [phone]  Primary Care Physician:  Weber Cooks, MD Primary Gastroenterologist:  Dr. Allen Norris  Pre-Procedure History & Physical: HPI:  Ryan Serrano is a 86 y.o. male is here for an EGD and colonoscopy.   Past Medical History:  Diagnosis Date  . Diabetes mellitus without complication ([place])   . GERD (gastroesophageal reflux disease)   . Heart murmur   . High cholesterol   . Hypertension   . Kidney stones   . Stroke [facility]) 2008  . Wears dentures    Full upper and lower    Past Surgical History:  Procedure Laterality Date  . ESOPHAGUS SURGERY    . FINGER AMPUTATION Left   . HEMORRHOID BANDING    . HERNIA REPAIR    . KIDNEY STONE SURGERY      Prior to Admission medications   Medication Sig Start Date End Date Taking? Authorizing Provider  acetaminophen (ACETAMINOPHEN 8 HOUR) 650 MG CR tablet Take 1,300 mg by mouth 2 (two) times daily.   Yes [provider]  amLODipine (NORVASC) 5 MG tablet Take 5 mg by mouth daily.  01/17/17  Yes [provider]  aspirin EC 81 MG tablet Take 81 mg by mouth daily.    Yes [provider]  diclofenac sodium (VOLTAREN) 1 % GEL Apply 2 g topically as needed.  03/08/19  Yes [provider]  ferrous gluconate (IRON 27) 240 (27 FE) MG tablet  04/25/19  Yes [provider]  fluticasone (FLONASE) 50 MCG/ACT nasal spray 1 spray by Each Nare route daily.   Yes [provider]  gabapentin (NEURONTIN) 300 MG capsule 600 mg Three (3) times a day. 06/20/13  Yes [provider]  lisinopril (ZESTRIL) 40 MG tablet Take 40 mg by mouth daily.  01/20/17  Yes [provider]  metFORMIN (GLUCOPHAGE-XR) 500 MG 24 hr tablet Take 500 mg by mouth 2 (two) times daily.  05/04/19  Yes [provider]  pantoprazole (PROTONIX) 40 MG tablet  05/28/20  Yes [provider]  simvastatin (ZOCOR) 20 MG  tablet Take 20 mg by mouth daily at 6 PM.  01/12/17  Yes [provider]  tamsulosin (FLOMAX) 0.4 MG CAPS capsule Take 0.4 mg by mouth daily.  02/04/17  Yes [provider]  vitamin B-12 (CYANOCOBALAMIN) 1000 MCG tablet Take 1,000 mcg by mouth daily.   Yes [provider]  Blood Glucose Monitoring Suppl (TRUE METRIX METER) w/Device KIT  07/14/20   [provider]  Na Sulfate-K Sulfate-Mg Sulf (SUPREP BOWEL PREP KIT) 17.5-3.13-1.6 GM/177ML SOLN Take 1 kit by mouth as directed. 07/23/20   Lucilla Lame, MD  TRUE METRIX BLOOD GLUCOSE TEST test strip  07/14/20   [provider]  [facility] Thin Lancets MISC Apply 1 each topically daily. 07/14/20   [provider]    Allergies as of 07/23/2020 - Review Complete 07/23/2020  Allergen Reaction Noted  . Hydrochlorothiazide  08/22/2013  . Penicillins Anaphylaxis and Other (See Comments) 04/06/2013  . Atorvastatin Other (See Comments) 11/30/2006  . Bee venom Other (See Comments) 11/27/2013  . Hydrocodone Other (See Comments) 11/27/2013  . Hydrocodone-acetaminophen Other (See Comments) 06/12/2013    Family History  Problem Relation Age of Onset  . Hypertension Mother   . Cancer Father   . Diabetes Father   . Hypertension Father     Social History   Socioeconomic History  . Marital status: Widowed  Spouse name: Not on file  . Number of children: Not on file  . Years of education: Not on file  . Highest education level: Not on file  Occupational History  . Not on file  Tobacco Use  . Smoking status: Former Smoker    Years: 16.00    Types: Cigarettes    Quit date: 1980    Years since quitting: 41.7  . Smokeless tobacco: Current User    Types: Chew  Vaping Use  . Vaping Use: Never used  Substance and Sexual Activity  . Alcohol use: Never  . Drug use: Never  . Sexual activity: Not on file  Other Topics Concern  . Not on file  Social History Narrative  . Not on file   Social  Determinants of Health   Financial Resource Strain:   . Difficulty of Paying Living Expenses: Not on file  Food Insecurity:   . Worried About Charity fundraiser in the Last Year: Not on file  . Ran Out of Food in the Last Year: Not on file  Transportation Needs:   . Lack of Transportation (Medical): Not on file  . Lack of Transportation (Non-Medical): Not on file  Physical Activity:   . Days of Exercise per Week: Not on file  . Minutes of Exercise per Session: Not on file  Stress:   . Feeling of Stress : Not on file  Social Connections:   . Frequency of Communication with Friends and Family: Not on file  . Frequency of Social Gatherings with Friends and Family: Not on file  . Attends Religious Services: Not on file  . Active Member of Clubs or Organizations: Not on file  . Attends Archivist Meetings: Not on file  . Marital Status: Not on file  Intimate Partner Violence:   . Fear of Current or Ex-Partner: Not on file  . Emotionally Abused: Not on file  . Physically Abused: Not on file  . Sexually Abused: Not on file    Review of Systems: See HPI, otherwise negative ROS  Physical Exam: Ht _0  (1.727 m)   Wt 70.8 kg   BMI 23.72 kg/m  General:   Alert,  pleasant and cooperative in NAD Head:  Normocephalic and atraumatic. Neck:  Supple; no masses or thyromegaly. Lungs:  Clear throughout to auscultation.    Heart:  Regular rate and rhythm. Abdomen:  Soft, nontender and nondistended. Normal bowel sounds, without guarding, and without rebound.   Neurologic:  Alert and  oriented x4;  grossly normal neurologically.  Impression/Plan: Ryan Serrano is here for an EGD and colonoscopy to be performed for iron def anemia  Risks, benefits, limitations, and alternatives regarding an EGD and  colonoscopy have been reviewed with the patient.  Questions have been answered.  All parties agreeable.   Lucilla Lame, MD  08/04/2020, 7:36 AM

## 2020-08-04 NOTE — Anesthesia Postprocedure Evaluation (Signed)
Anesthesia Post Note  Patient: Ryan Serrano  Procedure(s) Performed: COLONOSCOPY WITH PROPOFOL (N/A ) ESOPHAGOGASTRODUODENOSCOPY (EGD) WITH PROPOFOL (N/A ) BIOPSY (N/A Esophagus)     Patient location during evaluation: PACU Anesthesia Type: General Level of consciousness: awake and alert Pain management: pain level controlled Vital Signs Assessment: post-procedure vital signs reviewed and stable Respiratory status: spontaneous breathing Cardiovascular status: stable Postop Assessment: no headache Anesthetic complications: no   No complications documented.  Gillian Scarce

## 2020-08-04 NOTE — Anesthesia Preprocedure Evaluation (Signed)
Anesthesia Evaluation  Patient identified by MRN, date of birth, ID band Patient awake    Reviewed: Allergy & Precautions, H&P , NPO status , Patient's Chart, lab work & pertinent test results  Airway Mallampati: II   Neck ROM: full    Dental no notable dental hx.    Pulmonary neg pulmonary ROS, former smoker,    Pulmonary exam normal        Cardiovascular hypertension, On Medications Normal cardiovascular exam Rhythm:regular Rate:Normal     Neuro/Psych CVA negative psych ROS   GI/Hepatic Neg liver ROS, Medicated,  Endo/Other  diabetes, Well Controlled, Type 2  Renal/GU   negative genitourinary   Musculoskeletal   Abdominal   Peds  Hematology  (+) Blood dyscrasia, anemia ,   Anesthesia Other Findings   Reproductive/Obstetrics negative OB ROS                             Anesthesia Physical Anesthesia Plan  ASA: II  Anesthesia Plan: General   Post-op Pain Management:    Induction:   PONV Risk Score and Plan:   Airway Management Planned:   Additional Equipment:   Intra-op Plan:   Post-operative Plan:   Informed Consent: I have reviewed the patients History and Physical, chart, labs and discussed the procedure including the risks, benefits and alternatives for the proposed anesthesia with the patient or authorized representative who has indicated his/her understanding and acceptance.       Plan Discussed with:   Anesthesia Plan Comments:         Anesthesia Quick Evaluation

## 2020-08-04 NOTE — Op Note (Signed)
[facility] Gastroenterology Patient Name: Ryan Serrano Procedure Date: 08/04/2020 7:39 AM MRN: [id number] Account #: [id number] Date of Birth: 01-Mar-1933 Admit Type: Outpatient Age: 86 Room: [facility] OR ROOM 01 Gender: Male Note Status: Finalized Procedure:             Colonoscopy Indications:           Iron deficiency anemia Providers:             Lucilla Lame MD, MD Medicines:             Propofol per Anesthesia Complications:         No immediate complications. Procedure:             Pre-Anesthesia Assessment:                        - Prior to the procedure, a History and Physical was                         performed, and patient medications and allergies were                         reviewed. The patient's tolerance of previous                         anesthesia was also reviewed. The risks and benefits                         of the procedure and the sedation options and risks                         were discussed with the patient. All questions were                         answered, and informed consent was obtained. Prior                         Anticoagulants: The patient has taken no previous                         anticoagulant or antiplatelet agents. ASA Grade                         Assessment: II - A patient with mild systemic disease.                         After reviewing the risks and benefits, the patient                         was deemed in satisfactory condition to undergo the                         procedure.                        After obtaining informed consent, the colonoscope was                         passed under direct vision. Throughout the procedure,  the patient's blood pressure, pulse, and oxygen                         saturations were monitored continuously. The was                         introduced through the anus and advanced to the the                         cecum, identified by appendiceal orifice  and ileocecal                         valve. The colonoscopy was performed without                         difficulty. The patient tolerated the procedure well.                         The quality of the bowel preparation was good. Findings:      The perianal and digital rectal examinations were normal.      Multiple small-mouthed diverticula were found in the entire colon.      Non-bleeding internal hemorrhoids were found during retroflexion. The       hemorrhoids were Grade II (internal hemorrhoids that prolapse but reduce       spontaneously). Impression:            - Diverticulosis in the entire examined colon.                        - Non-bleeding internal hemorrhoids.                        - No specimens collected. Recommendation:        - Discharge patient to home.                        - Resume previous diet.                        - Recommend an iron supplement. Procedure Code(s):     --- Professional ---                        [phone], Colonoscopy, flexible; diagnostic, including                         collection of specimen(s) by brushing or washing, when                         performed (separate procedure) Diagnosis Code(s):     --- Professional ---                        D50.9, Iron deficiency anemia, unspecified CPT copyright 2019 American Medical Association. All rights reserved. The codes documented in this report are preliminary and upon coder review may  be revised to meet current compliance requirements. Lucilla Lame MD, MD 08/04/2020 8:30:41 AM This report has been signed electronically. Number of Addenda: 0 Note Initiated On: 08/04/2020 7:39 AM Scope Withdrawal Time: 0 hours 6 minutes 34 seconds  Total Procedure Duration: 0  hours 12 minutes 45 seconds  Estimated Blood Loss:  Estimated blood loss: none.      [facility]

## 2020-08-05 ENCOUNTER — Encounter: Payer: Self-pay | Admitting: Gastroenterology

## 2020-08-05 LAB — SURGICAL PATHOLOGY

## 2020-08-06 ENCOUNTER — Encounter: Payer: Self-pay | Admitting: Gastroenterology

## 2020-08-21 ENCOUNTER — Other Ambulatory Visit: Payer: Self-pay

## 2020-08-21 ENCOUNTER — Inpatient Hospital Stay: Payer: Medicare HMO | Attending: Hematology and Oncology

## 2020-08-21 DIAGNOSIS — D509 Iron deficiency anemia, unspecified: Secondary | ICD-10-CM | POA: Diagnosis present

## 2020-08-21 DIAGNOSIS — D472 Monoclonal gammopathy: Secondary | ICD-10-CM

## 2020-08-21 LAB — FERRITIN: Ferritin: 54 ng/mL (ref 24–336)

## 2020-08-21 LAB — CBC WITH DIFFERENTIAL/PLATELET
Abs Immature Granulocytes: 0.02 10*3/uL (ref 0.00–0.07)
Basophils Absolute: 0.1 10*3/uL (ref 0.0–0.1)
Basophils Relative: 1 %
Eosinophils Absolute: 0.1 10*3/uL (ref 0.0–0.5)
Eosinophils Relative: 1 %
HCT: 40.2 % (ref 39.0–52.0)
Hemoglobin: 13.4 g/dL (ref 13.0–17.0)
Immature Granulocytes: 0 %
Lymphocytes Relative: 21 %
Lymphs Abs: 1.6 10*3/uL (ref 0.7–4.0)
MCH: 30.8 pg (ref 26.0–34.0)
MCHC: 33.3 g/dL (ref 30.0–36.0)
MCV: 92.4 fL (ref 80.0–100.0)
Monocytes Absolute: 0.6 10*3/uL (ref 0.1–1.0)
Monocytes Relative: 8 %
Neutro Abs: 5.3 10*3/uL (ref 1.7–7.7)
Neutrophils Relative %: 69 %
Platelets: 150 10*3/uL (ref 150–400)
RBC: 4.35 MIL/uL (ref 4.22–5.81)
RDW: 13.2 % (ref 11.5–15.5)
WBC: 7.6 10*3/uL (ref 4.0–10.5)
nRBC: 0 % (ref 0.0–0.2)

## 2020-11-07 ENCOUNTER — Emergency Department: Payer: Medicare HMO

## 2020-11-07 ENCOUNTER — Observation Stay
Admission: EM | Admit: 2020-11-07 | Discharge: 2020-11-08 | Disposition: A | Discharge status: Home / Self Care | Payer: Medicare HMO | Attending: Internal Medicine | Admitting: Internal Medicine

## 2020-11-07 ENCOUNTER — Other Ambulatory Visit: Payer: Self-pay

## 2020-11-07 DIAGNOSIS — E119 Type 2 diabetes mellitus without complications: Secondary | ICD-10-CM | POA: Insufficient documentation

## 2020-11-07 DIAGNOSIS — R55 Syncope and collapse: Secondary | ICD-10-CM | POA: Diagnosis not present

## 2020-11-07 DIAGNOSIS — R1110 Vomiting, unspecified: Secondary | ICD-10-CM | POA: Insufficient documentation

## 2020-11-07 DIAGNOSIS — Z79899 Other long term (current) drug therapy: Secondary | ICD-10-CM | POA: Diagnosis not present

## 2020-11-07 DIAGNOSIS — Z20822 Contact with and (suspected) exposure to covid-19: Secondary | ICD-10-CM | POA: Insufficient documentation

## 2020-11-07 DIAGNOSIS — Z7982 Long term (current) use of aspirin: Secondary | ICD-10-CM | POA: Diagnosis not present

## 2020-11-07 DIAGNOSIS — K219 Gastro-esophageal reflux disease without esophagitis: Secondary | ICD-10-CM | POA: Diagnosis not present

## 2020-11-07 DIAGNOSIS — K5790 Diverticulosis of intestine, part unspecified, without perforation or abscess without bleeding: Secondary | ICD-10-CM | POA: Diagnosis present

## 2020-11-07 DIAGNOSIS — Z7984 Long term (current) use of oral hypoglycemic drugs: Secondary | ICD-10-CM | POA: Diagnosis not present

## 2020-11-07 DIAGNOSIS — I10 Essential (primary) hypertension: Secondary | ICD-10-CM | POA: Diagnosis not present

## 2020-11-07 DIAGNOSIS — R109 Unspecified abdominal pain: Secondary | ICD-10-CM | POA: Diagnosis not present

## 2020-11-07 DIAGNOSIS — N400 Enlarged prostate without lower urinary tract symptoms: Secondary | ICD-10-CM

## 2020-11-07 DIAGNOSIS — N179 Acute kidney failure, unspecified: Secondary | ICD-10-CM | POA: Diagnosis present

## 2020-11-07 DIAGNOSIS — Z87891 Personal history of nicotine dependence: Secondary | ICD-10-CM | POA: Insufficient documentation

## 2020-11-07 HISTORY — DX: Diverticulitis of intestine, part unspecified, without perforation or abscess without bleeding: K57.92

## 2020-11-07 LAB — CBC
HCT: 38.6 % — ABNORMAL LOW (ref 39.0–52.0)
Hemoglobin: 12.3 g/dL — ABNORMAL LOW (ref 13.0–17.0)
MCH: 30.3 pg (ref 26.0–34.0)
MCHC: 31.9 g/dL (ref 30.0–36.0)
MCV: 95.1 fL (ref 80.0–100.0)
Platelets: 126 10*3/uL — ABNORMAL LOW (ref 150–400)
RBC: 4.06 MIL/uL — ABNORMAL LOW (ref 4.22–5.81)
RDW: 13.2 % (ref 11.5–15.5)
WBC: 10.5 10*3/uL (ref 4.0–10.5)
nRBC: 0 % (ref 0.0–0.2)

## 2020-11-07 LAB — COMPREHENSIVE METABOLIC PANEL
ALT: 10 U/L (ref 0–44)
AST: 18 U/L (ref 15–41)
Albumin: 3.7 g/dL (ref 3.5–5.0)
Alkaline Phosphatase: 48 U/L (ref 38–126)
Anion gap: 12 (ref 5–15)
BUN: 15 mg/dL (ref 8–23)
CO2: 22 mmol/L (ref 22–32)
Calcium: 8.5 mg/dL — ABNORMAL LOW (ref 8.9–10.3)
Chloride: 107 mmol/L (ref 98–111)
Creatinine, Ser: 1.49 mg/dL — ABNORMAL HIGH (ref 0.61–1.24)
GFR, Estimated: 46 mL/min — ABNORMAL LOW (ref >60)
Glucose, Bld: 129 mg/dL — ABNORMAL HIGH (ref 70–99)
Potassium: 4.2 mmol/L (ref 3.5–5.1)
Sodium: 141 mmol/L (ref 135–145)
Total Bilirubin: 0.7 mg/dL (ref 0.3–1.2)
Total Protein: 6.9 g/dL (ref 6.5–8.1)

## 2020-11-07 LAB — TROPONIN I (HIGH SENSITIVITY)
Troponin I (High Sensitivity): 7 ng/L (ref <18)
Troponin I (High Sensitivity): 6 ng/L (ref <18)

## 2020-11-07 LAB — LIPASE, BLOOD: Lipase: 35 U/L (ref 11–51)

## 2020-11-07 MED ORDER — IOHEXOL 9 MG/ML PO SOLN
500.0000 mL | ORAL | Status: AC
  Administered 2020-11-07: 22:00:00 500 mL via ORAL

## 2020-11-07 MED ORDER — ONDANSETRON HCL 4 MG/2ML IJ SOLN
4.0000 mg | Freq: Once | INTRAMUSCULAR | Status: AC
  Administered 2020-11-07: 22:00:00 4 mg via INTRAVENOUS
  Filled 2020-11-07: qty 2

## 2020-11-07 MED ORDER — IOHEXOL 300 MG/ML  SOLN
100.0000 mL | Freq: Once | INTRAMUSCULAR | Status: AC | PRN
  Administered 2020-11-07: 23:00:00 100 mL via INTRAVENOUS

## 2020-11-07 MED ORDER — SODIUM CHLORIDE 0.9 % IV BOLUS
1000.0000 mL | Freq: Once | INTRAVENOUS | Status: AC
  Administered 2020-11-07: 22:00:00 1000 mL via INTRAVENOUS

## 2020-11-07 NOTE — ED Triage Notes (Signed)
First RN Note: Pt to ED via [facility] with c/o abdominal pain, dizziness, and projectile vomiting at this time. Per EMS pt received total of 8mg  Zofran en route, no vomiting since.    150/81 HR 72 99% RA CBG 144  20G R FA

## 2020-11-07 NOTE — ED Notes (Signed)
[place], primary rn notified of pt arrival to room.

## 2020-11-07 NOTE — ED Triage Notes (Signed)
Pt here with daughter- pt to ED via [facility] from home.  Pt with c/ o abdominal pain, dizziness, and projectile vomiting that started today. Pt was sitting talking when all of a sudden he went into a "daze" and passed out onto the floor, followed by vomiting. Pt does not remember the events. Denies hitting head when fell.

## 2020-11-07 NOTE — ED Provider Notes (Signed)
Admission discussed with Dr. Tobie Poet of hospitalist.  Patient and son both agreeable with plan for admission and further work-up   Delman Kitten, MD 11/07/20 [id number]

## 2020-11-07 NOTE — H&P (Signed)
History and Physical   Ryan Serrano TKZ:[id number] DOB: 04/09/1933 DOA: 11/07/2020  PCP: Weber Cooks, MD  Outpatient Specialists: [facility] healthcare gastroenterology, Dr. Rondell Reams Patient coming from: Home  I have personally briefly reviewed patient's old medical records in [place].  Chief Concern: Loss of consciousness, nausea, and vomiting  HPI: Ryan Serrano is a 86 y.o. male with medical history significant for hypertension, GERD, non-insulin-dependent diabetes mellitus, history of pancreatic mass followed by [facility] GI, renal cysts, presented to the emergency department for chief concerns of passing out.  Patient was sitting in chair and passed out for 2-3 minutes. He endorsed stooling on himself. He endorsed black stool. He takes iron tablets daily.  Patient states this is never happened before.  Son at bedside endorses the same.  Son states that patient and son were working in the garage.  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 Son did not notice jerking motions or seizures activities.   ROS was positive for nausea and vomiting x 2 at home, ambulance was called.  On arrival per patient son, EMT gave patient antinausea medications and son states patient vomited once in the ambulance prior to arrival in the ED.  Patient has not vomited in the ED.    The vomitus was food and negative for blood and/or black ground substances.   ROS was negative fopr chest pain, shortness of breath, headache, vision changes, dysphagia, odynophgai, fever, cough, dysuria, hematuria, diarrhea, blood in stool, swelling in lower extremities, difficulty ambulation.  Son at bedside denies slurring of speech. Patient denies diaphoresis, however family endorses clamminess.  Patient denies unintentional weight   Social history: He lives at home with his eldest son. 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 Currently chews/dips tobacco everyday. He  infrequently consumes etoh, and last drink was three months ago. He denies recreational drug use.   Patient endorses two months ago, he had stool leakage and had a colonoscopy and was told he had one polyp.   ED Course: Discussed with ED provider, requesting admission for syncope work-up.  Review of Systems: As per HPI otherwise 10 point review of systems negative.  Assessment/Plan  Principal Problem:   Syncope Active Problems:   Diverticulosis   Essential hypertension   Diabetes mellitus type 2, noninsulin dependent (HCC)   Chronic GERD   Enlarged prostate   Syncope-work-up in progress -CT of the head without contrast was read as negative for acute abnormality -CT of the abdomen and pelvis with contrast was read as no CT evidence for acute intra-abdominal or pelvic abnormality.  Enlarged prostate with mass-effect on the bladder.  Diffuse diverticular disease of the colon without acute inflammatory process.  2.1 cm complex cyst in the lower pole of the right kidney without significant change in size compared to prior.  Moderate hiatal hernia. -CT PE ordered for 11/09/2020 due to recent contrast -Echo ordered -Blood cultures x2  Hypertension-1 episode of 84/60 blood pressure query incorrect recording BP has been within normal limits during the entire examination -IVF normal saline -Holding home lisinopril -Resumed amlodipine for  Acute kidney injury- -Creatinine baseline is 1.0, 5 months ago -Creatinine on admission is 1.49, GFR 46 -Normal saline IVF -BMP in a.m. -Holding home lisinopril  Non-insulin-dependent diabetes mellitus-holding home Metformin -Insulin SSI with at bedtime coverage  Hyperlipidemia-resumed simvastatin 20 mg daily  GERD-resumed home Protonix  As needed medications: Ondansetron, acetaminophen  Chart reviewed.   DVT prophylaxis: Enoxaparin Code  Status: full time  Diet: Heart healthy/carb modified Family Communication: Updated son at  bedside Disposition Plan: Pending clinical course Consults called: None at this time Admission status: Observation  Past Medical History:  Diagnosis Date  . Diabetes mellitus without complication ([place])   . Diverticulitis   . GERD (gastroesophageal reflux disease)   . Heart murmur   . High cholesterol   . Hypertension   . Kidney stones   . Stroke [facility]) 2008  . Wears dentures    Full upper and lower   Past Surgical History:  Procedure Laterality Date  . BIOPSY N/A 08/04/2020   Procedure: BIOPSY;  Surgeon: Lucilla Lame, MD;  Location: [place];  Service: Endoscopy;  Laterality: N/A;  . COLONOSCOPY WITH PROPOFOL N/A 08/04/2020   Procedure: COLONOSCOPY WITH PROPOFOL;  Surgeon: Lucilla Lame, MD;  Location: [place];  Service: Endoscopy;  Laterality: N/A;  . ESOPHAGOGASTRODUODENOSCOPY (EGD) WITH PROPOFOL N/A 08/04/2020   Procedure: ESOPHAGOGASTRODUODENOSCOPY (EGD) WITH PROPOFOL;  Surgeon: Lucilla Lame, MD;  Location: [place];  Service: Endoscopy;  Laterality: N/A;  Diabetic - oral meds  . ESOPHAGUS SURGERY    . FINGER AMPUTATION Left   . HEMORRHOID BANDING    . HERNIA REPAIR    . KIDNEY STONE SURGERY     Social History:  reports that he quit smoking about 41 years ago. His smoking use included cigarettes. He quit after 16.00 years of use. His smokeless tobacco use includes chew. He reports current alcohol use. He reports that he does not use drugs.  Allergies  Allergen Reactions  . Hydrochlorothiazide     Other reaction(s): hyponatremia-stopped by cards  . Penicillins Anaphylaxis and Other (See Comments)  . Atorvastatin Other (See Comments)    Other reaction(s): myalgias 12-06   . Bee Venom Other (See Comments)  . Hydrocodone Nausea And Vomiting  . Hydrocodone-Acetaminophen Nausea And Vomiting   Family History  Problem Relation Age of Onset  . Hypertension Mother   . Cancer Father   . Diabetes Father   . Hypertension Father    Family  history: Family history reviewed and not pertinent  Prior to Admission medications   Medication Sig Start Date End Date Taking? Authorizing Provider  acetaminophen (ACETAMINOPHEN 8 HOUR) 650 MG CR tablet Take 1,300 mg by mouth 2 (two) times daily.    [provider]  amLODipine (NORVASC) 5 MG tablet Take 5 mg by mouth daily.  01/17/17   [provider]  aspirin EC 81 MG tablet Take 81 mg by mouth daily.     [provider]  Blood Glucose Monitoring Suppl (TRUE METRIX METER) w/Device KIT  07/14/20   [provider]  diclofenac sodium (VOLTAREN) 1 % GEL Apply 2 g topically as needed.  03/08/19   [provider]  ferrous gluconate (IRON 27) 240 (27 FE) MG tablet  04/25/19   [provider]  fluticasone (FLONASE) 50 MCG/ACT nasal spray 1 spray by Each Nare route daily.    [provider]  gabapentin (NEURONTIN) 300 MG capsule 600 mg Three (3) times a day. 06/20/13   [provider]  lisinopril (ZESTRIL) 40 MG tablet Take 40 mg by mouth daily.  01/20/17   [provider]  metFORMIN (GLUCOPHAGE-XR) 500 MG 24 hr tablet Take 500 mg by mouth 2 (two) times daily.  05/04/19   [provider]  Na Sulfate-K Sulfate-Mg Sulf (SUPREP BOWEL PREP KIT) 17.5-3.13-1.6 GM/177ML SOLN Take 1 kit by mouth as directed. 07/23/20  Lucilla Lame, MD  pantoprazole (PROTONIX) 40 MG tablet  05/28/20   [provider]  simvastatin (ZOCOR) 20 MG tablet Take 20 mg by mouth daily at 6 PM.  01/12/17   [provider]  tamsulosin (FLOMAX) 0.4 MG CAPS capsule Take 0.4 mg by mouth daily.  02/04/17   [provider]  TRUE METRIX BLOOD GLUCOSE TEST test strip  07/14/20   [provider]  vitamin B-12 (CYANOCOBALAMIN) 1000 MCG tablet Take 1,000 mcg by mouth daily.    [provider]  [facility] Thin Lancets MISC Apply 1 each topically daily. 07/14/20   [provider]   Physical Exam: Vitals:   11/07/20 1708  11/07/20 1922 11/07/20 2120 11/07/20 2132  BP:  98/64 (!) 84/60 135/77  Pulse:  78 73 63  Resp:  16  14  Temp:   98 F (36.7 C)   TempSrc:   Oral   SpO2:  95% 98% 96%  Weight: 69.9 kg     Height: 5' 8"  (1.727 m)      Constitutional: appears age-appropriate, NAD, calm, comfortable Eyes: PERRL, lids and conjunctivae normal ENMT: Mucous membranes are moist. Posterior pharynx clear of any exudate or lesions. Age-appropriate dentition.  Mild hearing loss Neck: normal, supple, no masses, no thyromegaly Respiratory: clear to auscultation bilaterally, no wheezing, no crackles. Normal respiratory effort. No accessory muscle use.  Cardiovascular: Regular rate and rhythm, no murmurs / rubs / gallops. No extremity edema. 2+ pedal pulses. No carotid bruits.  Abdomen: no tenderness, no masses palpated, no hepatosplenomegaly. Bowel sounds positive.  Musculoskeletal: no clubbing / cyanosis. No joint deformity upper and lower extremities. Good ROM, no contractures, no atrophy. Normal muscle tone.  Skin: no rashes, lesions, ulcers. No induration Neurologic: Sensation intact. Strength 5/5 in all 4.  Psychiatric: Normal judgment and insight. Alert and oriented x 3. Normal mood.   EKG: Independently reviewed, showing normal saline, rate of 69, QTc 432  Chest x-ray on Admission: Order chest x-ray  CT Head Wo Contrast  Result Date: 11/07/2020 CLINICAL DATA:  Nausea vomiting dizziness EXAM: CT HEAD WITHOUT CONTRAST TECHNIQUE: Contiguous axial images were obtained from the base of the skull through the vertex without intravenous contrast. COMPARISON:  None. FINDINGS: Brain: No acute territorial infarction, hemorrhage or intracranial mass. Mild atrophy. Mild hypodensity in the white matter consistent with chronic small vessel ischemic change. The ventricles are nonenlarged Vascular: No hyperdense vessels.  Carotid vascular calcification Skull: Normal. Negative for fracture or focal lesion. Sinuses/Orbits:  Mucosal thickening in the sinuses Other: None IMPRESSION: 1. No CT evidence for acute intracranial abnormality. 2. Atrophy and mild chronic small vessel ischemic changes of the white matter. Electronically Signed   By: Donavan Foil M.D.   On: 11/07/2020 23:12   CT ABDOMEN PELVIS W CONTRAST  Result Date: 11/07/2020 CLINICAL DATA:  Nausea vomiting and abdominal pain EXAM: CT ABDOMEN AND PELVIS WITH CONTRAST TECHNIQUE: Multidetector CT imaging of the abdomen and pelvis was performed using the standard protocol following bolus administration of intravenous contrast. CONTRAST:  133m OMNIPAQUE IOHEXOL 300 MG/ML  SOLN COMPARISON:  CT 12/17/2009, 01/25/2005 FINDINGS: Lower chest: Lung bases demonstrate no acute consolidation or effusion. Coronary vascular calcification. Moderate hiatal hernia. Hepatobiliary: Subcentimeter hypodensities in the liver too small to further characterize. No calcified gallstone or biliary dilatation Pancreas: Unremarkable. No pancreatic ductal dilatation or surrounding inflammatory changes. Spleen: Normal in size without focal abnormality. Adrenals/Urinary Tract: Adrenal glands are unremarkable. Kidneys are normal, without renal calculi, focal lesion, or hydronephrosis.  Bladder is unremarkable. Stomach/Bowel: Adrenal glands are normal. Kidneys show no hydronephrosis. Complex cyst lower pole right kidney measuring up to 2.1 cm with increased peripheral calcification compared to prior but no significant increase in size. The urinary bladder is unremarkable. Diffuse diverticular disease of the colon with more heavily concentrated diverticula at the sigmoid colon. Vascular/Lymphatic: Extensive aortic atherosclerosis. No aneurysm. No suspicious nodes Reproductive: Enlarged prostate with mass effect on the bladder. Other: No free air or free fluid. Musculoskeletal: Degenerative changes of the spine. No acute or suspicious osseous abnormality IMPRESSION: 1. No CT evidence for acute  intra-abdominal or pelvic abnormality. 2. Diffuse diverticular disease of the colon without acute inflammatory process. 3. Enlarged prostate with mass effect on the bladder. 4. 2.1 cm complex cyst lower pole right kidney without significant change in size compared to prior, though now with increased peripheral or wall calcification which is coarse and nodular. Lesion is presumed benign given lack of significant interval change as compared with CT from 2011. 5. Moderate hiatal hernia. Aortic Atherosclerosis (ICD10-I70.0). Electronically Signed   By: Donavan Foil M.D.   On: 11/07/2020 23:23   Labs on Admission: I have personally reviewed following labs  CBC: Recent Labs  Lab 11/07/20 1714  WBC 10.5  HGB 12.3*  HCT 38.6*  MCV 95.1  PLT 001*   Basic Metabolic Panel: Recent Labs  Lab 11/07/20 1714  NA 141  K 4.2  CL 107  CO2 22  GLUCOSE 129*  BUN 15  CREATININE 1.49*  CALCIUM 8.5*   GFR: Estimated Creatinine Clearance: 35.7 mL/min (A) (by C-G formula based on SCr of 1.49 mg/dL (H)).  Liver Function Tests: Recent Labs  Lab 11/07/20 1714  AST 18  ALT 10  ALKPHOS 48  BILITOT 0.7  PROT 6.9  ALBUMIN 3.7   Recent Labs  Lab 11/07/20 1714  LIPASE 35   Urine analysis:    Component Value Date/Time   COLORURINE COLORLESS (A) 11/07/2020 2353   APPEARANCEUR CLEAR (A) 11/07/2020 2353   LABSPEC 1.013 11/07/2020 2353   PHURINE 6.0 11/07/2020 [place] 11/07/2020 2353   HGBUR NEGATIVE 11/07/2020 2353   BILIRUBINUR NEGATIVE 11/07/2020 2353   KETONESUR NEGATIVE 11/07/2020 2353   PROTEINUR NEGATIVE 11/07/2020 2353   NITRITE NEGATIVE 11/07/2020 2353   LEUKOCYTESUR NEGATIVE 11/07/2020 2353   Iyani Dresner N Kirt Chew D.O. [facility]  If 12AM-7AM, please contact overnight-coverage provider If 7AM-7PM, please contact day coverage provider [email or web address]  11/08/2020, 12:31 AM

## 2020-11-07 NOTE — ED Provider Notes (Signed)
CT Head Wo Contrast  Result Date: 11/07/2020 CLINICAL DATA:  Nausea vomiting dizziness EXAM: CT HEAD WITHOUT CONTRAST TECHNIQUE: Contiguous axial images were obtained from the base of the skull through the vertex without intravenous contrast. COMPARISON:  None. FINDINGS: Brain: No acute territorial infarction, hemorrhage or intracranial mass. Mild atrophy. Mild hypodensity in the white matter consistent with chronic small vessel ischemic change. The ventricles are nonenlarged Vascular: No hyperdense vessels.  Carotid vascular calcification Skull: Normal. Negative for fracture or focal lesion. Sinuses/Orbits: Mucosal thickening in the sinuses Other: None IMPRESSION: 1. No CT evidence for acute intracranial abnormality. 2. Atrophy and mild chronic small vessel ischemic changes of the white matter. Electronically Signed   By: Donavan Foil M.D.   On: 11/07/2020 23:12   CT ABDOMEN PELVIS W CONTRAST  Result Date: 11/07/2020 CLINICAL DATA:  Nausea vomiting and abdominal pain EXAM: CT ABDOMEN AND PELVIS WITH CONTRAST TECHNIQUE: Multidetector CT imaging of the abdomen and pelvis was performed using the standard protocol following bolus administration of intravenous contrast. CONTRAST:  145mL OMNIPAQUE IOHEXOL 300 MG/ML  SOLN COMPARISON:  CT 12/17/2009, 01/25/2005 FINDINGS: Lower chest: Lung bases demonstrate no acute consolidation or effusion. Coronary vascular calcification. Moderate hiatal hernia. Hepatobiliary: Subcentimeter hypodensities in the liver too small to further characterize. No calcified gallstone or biliary dilatation Pancreas: Unremarkable. No pancreatic ductal dilatation or surrounding inflammatory changes. Spleen: Normal in size without focal abnormality. Adrenals/Urinary Tract: Adrenal glands are unremarkable. Kidneys are normal, without renal calculi, focal lesion, or hydronephrosis. Bladder is unremarkable. Stomach/Bowel: Adrenal glands are normal. Kidneys show no hydronephrosis. Complex cyst  lower pole right kidney measuring up to 2.1 cm with increased peripheral calcification compared to prior but no significant increase in size. The urinary bladder is unremarkable. Diffuse diverticular disease of the colon with more heavily concentrated diverticula at the sigmoid colon. Vascular/Lymphatic: Extensive aortic atherosclerosis. No aneurysm. No suspicious nodes Reproductive: Enlarged prostate with mass effect on the bladder. Other: No free air or free fluid. Musculoskeletal: Degenerative changes of the spine. No acute or suspicious osseous abnormality IMPRESSION: 1. No CT evidence for acute intra-abdominal or pelvic abnormality. 2. Diffuse diverticular disease of the colon without acute inflammatory process. 3. Enlarged prostate with mass effect on the bladder. 4. 2.1 cm complex cyst lower pole right kidney without significant change in size compared to prior, though now with increased peripheral or wall calcification which is coarse and nodular. Lesion is presumed benign given lack of significant interval change as compared with CT from 2011. 5. Moderate hiatal hernia. Aortic Atherosclerosis (ICD10-I70.0). Electronically Signed   By: Donavan Foil M.D.   On: 11/07/2020 23:23     CT of the head and abdomen pelvis reviewed, no acute pathology is noted. additional findings as above as noted by radiologist as above       Delman Kitten, MD 11/07/20 2336

## 2020-11-07 NOTE — ED Notes (Signed)
Dr. Jacqualine Code notified of repeat blood pressure, awaiting orders.

## 2020-11-07 NOTE — ED Provider Notes (Signed)
[facility] Emergency Department Provider Note   ____________________________________________   Event Date/Time   First MD Initiated Contact with Patient 11/07/20 2146     (approximate)  I have reviewed the triage vital signs and the nursing notes.   HISTORY  Chief Complaint Abdominal Pain    HPI Ryan Serrano is a 86 y.o. male history diabetes diverticulitis hypertension kidney stones previous stroke  Patient presents today, reports that about noon today suddenly began having abdominal pain he has been having waves of crampy pain.  His son reports that he was complaining of abdominal pain when he was sitting in a chair and breath passed out for about a minute or 2.  He came to and then started vomiting several times complaining of abdominal pain.  He reports he has had a history of diverticulitis.  No black or bloody vomit.  No diarrhea.  Reports his pain keeps coming and going in waves of discomfort in his abdomen.  There is no chest pain no shortness of breath.  He had a light headache last night which is not unusual went away after he take over-the-counter medicine.  Right now his pain is mild if any, but he reports it will come and go in waves.  He vomited several times at home after a brief episode where he passed out.   Past Medical History:  Diagnosis Date  . Diabetes mellitus without complication ([place])   . Diverticulitis   . GERD (gastroesophageal reflux disease)   . Heart murmur   . High cholesterol   . Hypertension   . Kidney stones   . Stroke [facility]) 2008  . Wears dentures    Full upper and lower    Patient Active Problem List   Diagnosis Date Noted  . Gastric polyp   . Iron deficiency anemia 07/25/2019  . Monoclonal gammopathy 07/25/2019  . Pancreatic mass 01/21/2016  . Diverticulosis 01/17/2016  . Lower GI bleed 01/17/2016  . Weakness 06/12/2013    Past Surgical History:  Procedure Laterality Date  . BIOPSY N/A 08/04/2020    Procedure: BIOPSY;  Surgeon: Lucilla Lame, MD;  Location: [place];  Service: Endoscopy;  Laterality: N/A;  . COLONOSCOPY WITH PROPOFOL N/A 08/04/2020   Procedure: COLONOSCOPY WITH PROPOFOL;  Surgeon: Lucilla Lame, MD;  Location: [place];  Service: Endoscopy;  Laterality: N/A;  . ESOPHAGOGASTRODUODENOSCOPY (EGD) WITH PROPOFOL N/A 08/04/2020   Procedure: ESOPHAGOGASTRODUODENOSCOPY (EGD) WITH PROPOFOL;  Surgeon: Lucilla Lame, MD;  Location: [place];  Service: Endoscopy;  Laterality: N/A;  Diabetic - oral meds  . ESOPHAGUS SURGERY    . FINGER AMPUTATION Left   . HEMORRHOID BANDING    . HERNIA REPAIR    . KIDNEY STONE SURGERY      Prior to Admission medications   Medication Sig Start Date End Date Taking? Authorizing Provider  acetaminophen (ACETAMINOPHEN 8 HOUR) 650 MG CR tablet Take 1,300 mg by mouth 2 (two) times daily.    [provider]  amLODipine (NORVASC) 5 MG tablet Take 5 mg by mouth daily.  01/17/17   [provider]  aspirin EC 81 MG tablet Take 81 mg by mouth daily.     [provider]  Blood Glucose Monitoring Suppl (TRUE METRIX METER) w/Device KIT  07/14/20   [provider]  diclofenac sodium (VOLTAREN) 1 % GEL Apply 2 g topically as needed.  03/08/19   [provider]  ferrous gluconate (IRON 27) 240 (27 FE) MG tablet  04/25/19   [provider]  fluticasone (FLONASE) 50 MCG/ACT nasal spray 1 spray by Each Nare route daily.    [provider]  gabapentin (NEURONTIN) 300 MG capsule 600 mg Three (3) times a day. 06/20/13   [provider]  lisinopril (ZESTRIL) 40 MG tablet Take 40 mg by mouth daily.  01/20/17   [provider]  metFORMIN (GLUCOPHAGE-XR) 500 MG 24 hr tablet Take 500 mg by mouth 2 (two) times daily.  05/04/19   [provider]  Na Sulfate-K Sulfate-Mg Sulf (SUPREP BOWEL PREP KIT) 17.5-3.13-1.6 GM/177ML SOLN Take 1 kit by mouth as directed. 07/23/20   Lucilla Lame, MD  pantoprazole (PROTONIX) 40 MG tablet  05/28/20   [provider]  simvastatin (ZOCOR) 20 MG tablet Take 20 mg by mouth daily at 6 PM.  01/12/17   [provider]  tamsulosin (FLOMAX) 0.4 MG CAPS capsule Take 0.4 mg by mouth daily.  02/04/17   [provider]  TRUE METRIX BLOOD GLUCOSE TEST test strip  07/14/20   [provider]  vitamin B-12 (CYANOCOBALAMIN) 1000 MCG tablet Take 1,000 mcg by mouth daily.    [provider]  [facility] Thin Lancets MISC Apply 1 each topically daily. 07/14/20   [provider]    Allergies Hydrochlorothiazide, Penicillins, Atorvastatin, Bee venom, Hydrocodone, and Hydrocodone-acetaminophen  Family History  Problem Relation Age of Onset  . Hypertension Mother   . Cancer Father   . Diabetes Father   . Hypertension Father     Social History Social History   Tobacco Use  . Smoking status: Former Smoker    Years: 16.00    Types: Cigarettes    Quit date: 1980    Years since quitting: 41.9  . Smokeless tobacco: Current User    Types: Chew  Vaping Use  . Vaping Use: Never used  Substance Use Topics  . Alcohol use: Yes  . Drug use: Never    Review of Systems Constitutional: No fever/chills Eyes: No visual changes. ENT: No sore throat. Cardiovascular: Denies chest pain. Respiratory: Denies shortness of breath. Gastrointestinal: Crampy waves of intermittent abdominal pain in the lower mid abdomen Genitourinary: Negative for dysuria.  He did have an episode of incontinence when he passed out. Musculoskeletal: Negative for back pain. Skin: Negative for rash. Neurological: Negative for areas of focal weakness or numbness.  No speech changes.  Son at bedside as well, witnessed the patient passed out in a chair for about 2 minutes.  He did come to, and vomited several times thereafter.  No seizure-like activity.   ____________________________________________   PHYSICAL EXAM:  VITAL  SIGNS: ED Triage Vitals  Enc Vitals Group     BP 11/07/20 1707 133/72     Pulse Rate 11/07/20 1707 70     Resp 11/07/20 1707 18     Temp 11/07/20 1707 97.6 F (36.4 C)     Temp Source 11/07/20 1707 Oral     SpO2 11/07/20 1707 98 %     Weight 11/07/20 1708 154 lb (69.9 kg)     Height 11/07/20 1708 5' 8"  (1.727 m)     Head Circumference --      Peak Flow --      Pain Score 11/07/20 1708 8     Pain Loc --      Pain Edu? --      Excl. in [place]? --     Constitutional: Alert and oriented. Well appearing and in no acute distress.  Eyes: Conjunctivae are normal. Head: Atraumatic. Nose: No congestion/rhinnorhea. Mouth/Throat: Mucous membranes are moist. Neck: No stridor.  Cardiovascular: Normal rate, regular rhythm. Grossly normal heart sounds.  Good peripheral circulation. Respiratory: Normal respiratory effort.  No retractions. Lungs CTAB. Gastrointestinal: Soft and nontender. No distention. Musculoskeletal: No lower extremity tenderness nor edema. Neurologic:  Normal speech and language. No gross focal neurologic deficits are appreciated.  Skin:  Skin is warm, dry and intact. No rash noted. Psychiatric: Mood and affect are normal. Speech and behavior are normal.  ____________________________________________   LABS (all labs ordered are listed, but only abnormal results are displayed)  Labs Reviewed  COMPREHENSIVE METABOLIC PANEL - Abnormal; Notable for the following components:      Result Value   Glucose, Bld 129 (*)    Creatinine, Ser 1.49 (*)    Calcium 8.5 (*)    GFR, Estimated 46 (*)    All other components within normal limits  CBC - Abnormal; Notable for the following components:   RBC 4.06 (*)    Hemoglobin 12.3 (*)    HCT 38.6 (*)    Platelets 126 (*)    All other components within normal limits  LIPASE, BLOOD  URINALYSIS, COMPLETE (UACMP) WITH MICROSCOPIC  TROPONIN I (HIGH SENSITIVITY)  TROPONIN I (HIGH SENSITIVITY)    ____________________________________________  EKG  Reviewed inter by me at 1705 Heart rate 70 QRS 80 QTc 430 Normal sinus rhythm, left axis deviation.  No evidence of acute ischemia ____________________________________________  RADIOLOGY  Pending CT of the head as well as abdomen pelvis ____________________________________________   PROCEDURES  Procedure(s) performed: None  Procedures  Critical Care performed: No  ____________________________________________   INITIAL IMPRESSION / ASSESSMENT AND PLAN / ED COURSE  Pertinent labs & imaging results that were available during my care of the patient were reviewed by me and considered in my medical decision making (see chart for details).   Differential diagnosis includes but is not limited to, abdominal perforation, aortic dissection, cholecystitis, appendicitis, diverticulitis, colitis, esophagitis/gastritis, kidney stone, pyelonephritis, urinary tract infection, aortic aneurysm. All are considered in decision and treatment plan. Based upon the patient's presentation and risk factors, will hydrate, provide Zofran and obtain CT imaging to further evaluate for potential intra-abdominal pathologies.  In addition, patient also had associated syncopal episode it appears that he was having abdominal pain prior to syncope, but given his history of syncope with multiple vomiting and a slight headache last night I do wish to obtain CT of the head to exclude acute intracranial pathology though he does not have evidence of acute neurologic deficit or abnormality at this time.  Reassuring EKG and first troponin no complaint of chest pain or shortness of breath.  ----------------------------------------- 11:02 PM on 11/07/2020 -----------------------------------------  Ongoing care signed Dr. Beather Arbour, follow-up on reassessments as well as imaging of the head chest abdomen pelvis.  Anticipate need for admission thereafter, follow-up on  imaging findings and reassessments.  Patient is deemed high risk syncope as he did present and had at least 1 episode of documented hypotension in the ER which seems to have resolved now. Anticipate patient will need admission.     ____________________________________________   FINAL CLINICAL IMPRESSION(S) / ED DIAGNOSES  Final diagnoses:  Syncope and collapse  Abdominal pain with vomiting        Note:  This document was prepared using Dragon voice recognition software and may include unintentional dictation errors       Delman Kitten, MD 11/07/20 2307

## 2020-11-08 ENCOUNTER — Observation Stay: Payer: Medicare HMO

## 2020-11-08 ENCOUNTER — Observation Stay
Admit: 2020-11-08 | Discharge: 2020-11-08 | Disposition: A | Discharge status: Home / Self Care | Payer: Medicare HMO | Attending: Internal Medicine | Admitting: Internal Medicine

## 2020-11-08 DIAGNOSIS — R55 Syncope and collapse: Secondary | ICD-10-CM | POA: Diagnosis not present

## 2020-11-08 DIAGNOSIS — N179 Acute kidney failure, unspecified: Secondary | ICD-10-CM | POA: Diagnosis present

## 2020-11-08 DIAGNOSIS — N400 Enlarged prostate without lower urinary tract symptoms: Secondary | ICD-10-CM

## 2020-11-08 DIAGNOSIS — R1110 Vomiting, unspecified: Secondary | ICD-10-CM | POA: Insufficient documentation

## 2020-11-08 DIAGNOSIS — I10 Essential (primary) hypertension: Secondary | ICD-10-CM

## 2020-11-08 DIAGNOSIS — K219 Gastro-esophageal reflux disease without esophagitis: Secondary | ICD-10-CM

## 2020-11-08 DIAGNOSIS — R109 Unspecified abdominal pain: Secondary | ICD-10-CM | POA: Insufficient documentation

## 2020-11-08 DIAGNOSIS — E119 Type 2 diabetes mellitus without complications: Secondary | ICD-10-CM

## 2020-11-08 LAB — URINALYSIS, COMPLETE (UACMP) WITH MICROSCOPIC
Bacteria, UA: NONE SEEN
Bilirubin Urine: NEGATIVE
Glucose, UA: NEGATIVE mg/dL
Hgb urine dipstick: NEGATIVE
Ketones, ur: NEGATIVE mg/dL
Leukocytes,Ua: NEGATIVE
Nitrite: NEGATIVE
Protein, ur: NEGATIVE mg/dL
Specific Gravity, Urine: 1.013 (ref 1.005–1.030)
Squamous Epithelial / HPF: NONE SEEN (ref 0–5)
WBC, UA: NONE SEEN WBC/hpf (ref 0–5)
pH: 6 (ref 5.0–8.0)

## 2020-11-08 LAB — RESP PANEL BY RT-PCR (FLU A&B, COVID) ARPGX2
Influenza A by PCR: NEGATIVE
Influenza B by PCR: NEGATIVE
SARS Coronavirus 2 by RT PCR: NEGATIVE

## 2020-11-08 LAB — BASIC METABOLIC PANEL
Anion gap: 6 (ref 5–15)
BUN: 13 mg/dL (ref 8–23)
CO2: 25 mmol/L (ref 22–32)
Calcium: 8.6 mg/dL — ABNORMAL LOW (ref 8.9–10.3)
Chloride: 111 mmol/L (ref 98–111)
Creatinine, Ser: 1.1 mg/dL (ref 0.61–1.24)
GFR, Estimated: >60 mL/min (ref >60)
Glucose, Bld: 88 mg/dL (ref 70–99)
Potassium: 4.3 mmol/L (ref 3.5–5.1)
Sodium: 142 mmol/L (ref 135–145)

## 2020-11-08 LAB — CBG MONITORING, ED
Glucose-Capillary: 87 mg/dL (ref 70–99)
Glucose-Capillary: 89 mg/dL (ref 70–99)
Glucose-Capillary: 78 mg/dL (ref 70–99)
Glucose-Capillary: 149 mg/dL — ABNORMAL HIGH (ref 70–99)

## 2020-11-08 LAB — HEMOGLOBIN A1C
Hgb A1c MFr Bld: 6.6 % — ABNORMAL HIGH (ref 4.8–5.6)
Mean Plasma Glucose: 142.72 mg/dL

## 2020-11-08 LAB — ECHOCARDIOGRAM COMPLETE
AR max vel: 2.3 cm2
AV Area VTI: 2 cm2
AV Area mean vel: 2.14 cm2
AV Mean grad: 4 mmHg
AV Peak grad: 6.7 mmHg
Ao pk vel: 1.29 m/s
Area-P 1/2: 1.62 cm2
Height: 68 in
S' Lateral: 3.03 cm
Weight: 2464 oz

## 2020-11-08 LAB — TSH: TSH: 0.947 u[IU]/mL (ref 0.350–4.500)

## 2020-11-08 LAB — BRAIN NATRIURETIC PEPTIDE: B Natriuretic Peptide: 58.4 pg/mL (ref 0.0–100.0)

## 2020-11-08 MED ORDER — INSULIN ASPART 100 UNIT/ML ~~LOC~~ SOLN: 0.0000 [IU] | Freq: Every day | SUBCUTANEOUS | Status: DC

## 2020-11-08 MED ORDER — ACETAMINOPHEN 650 MG RE SUPP: 325.0000 mg | Freq: Four times a day (QID) | RECTAL | Status: DC | PRN

## 2020-11-08 MED ORDER — PANTOPRAZOLE SODIUM 40 MG PO TBEC
40.0000 mg | DELAYED_RELEASE_TABLET | Freq: Every day | ORAL | Status: DC
  Administered 2020-11-08: 09:00:00 40 mg via ORAL
  Filled 2020-11-08: qty 1

## 2020-11-08 MED ORDER — ASPIRIN EC 81 MG PO TBEC
81.0000 mg | DELAYED_RELEASE_TABLET | Freq: Every day | ORAL | Status: DC
  Administered 2020-11-08: 09:00:00 81 mg via ORAL
  Filled 2020-11-08: qty 1

## 2020-11-08 MED ORDER — PERFLUTREN LIPID MICROSPHERE
1.0000 mL | INTRAVENOUS | Status: AC | PRN
Start: 2020-11-08 — End: 2020-11-08
  Administered 2020-11-08: 09:00:00 2 mL via INTRAVENOUS
  Filled 2020-11-08: qty 10

## 2020-11-08 MED ORDER — AMLODIPINE BESYLATE 5 MG PO TABS
5.0000 mg | ORAL_TABLET | Freq: Every day | ORAL | Status: DC
  Filled 2020-11-08: qty 1

## 2020-11-08 MED ORDER — ONDANSETRON HCL 4 MG PO TABS: 4.0000 mg | ORAL_TABLET | Freq: Four times a day (QID) | ORAL | Status: DC | PRN

## 2020-11-08 MED ORDER — ONDANSETRON HCL 4 MG/2ML IJ SOLN: 4.0000 mg | Freq: Four times a day (QID) | INTRAMUSCULAR | Status: DC | PRN

## 2020-11-08 MED ORDER — SODIUM CHLORIDE 0.9 % IV SOLN: INTRAVENOUS | Status: DC

## 2020-11-08 MED ORDER — VITAMIN B-12 1000 MCG PO TABS
1000.0000 ug | ORAL_TABLET | Freq: Every day | ORAL | Status: DC
  Administered 2020-11-08: 09:00:00 1000 ug via ORAL
  Filled 2020-11-08: qty 1

## 2020-11-08 MED ORDER — INSULIN ASPART 100 UNIT/ML ~~LOC~~ SOLN
0.0000 [IU] | Freq: Three times a day (TID) | SUBCUTANEOUS | Status: DC
  Administered 2020-11-08: 12:00:00 2 [IU] via SUBCUTANEOUS
  Filled 2020-11-08: qty 1

## 2020-11-08 MED ORDER — SODIUM CHLORIDE 0.9% FLUSH
3.0000 mL | Freq: Two times a day (BID) | INTRAVENOUS | Status: DC
  Administered 2020-11-08 (×2): 3 mL via INTRAVENOUS

## 2020-11-08 MED ORDER — ACETAMINOPHEN 325 MG PO TABS: 325.0000 mg | ORAL_TABLET | Freq: Four times a day (QID) | ORAL | Status: DC | PRN

## 2020-11-08 MED ORDER — SIMVASTATIN 10 MG PO TABS: 20.0000 mg | ORAL_TABLET | Freq: Every day | ORAL | Status: DC

## 2020-11-08 MED ORDER — ENOXAPARIN SODIUM 40 MG/0.4ML ~~LOC~~ SOLN
40.0000 mg | SUBCUTANEOUS | Status: DC
  Administered 2020-11-08: 09:00:00 40 mg via SUBCUTANEOUS
  Filled 2020-11-08: qty 0.4

## 2020-11-08 NOTE — Progress Notes (Signed)
Per Pts daughter, pt is very acceptable to pain medication. He can only handle low doses of these medications.

## 2020-11-08 NOTE — ED Notes (Signed)
Pt up to restroom without assistance.  Pt urinated and had BM.  Pt walked back to bed and hooked up to monitor at this time.

## 2020-11-08 NOTE — ED Notes (Signed)
Admitting MD Fransico Setters regarding reevaluation of pt status for d/c eligibility at this time.

## 2020-11-08 NOTE — ED Notes (Signed)
Echo at bedside

## 2020-11-08 NOTE — ED Notes (Signed)
Meal tray given to pt. PT at bedside.

## 2020-11-08 NOTE — ED Notes (Signed)
Pt ambulating in hall with PT.

## 2020-11-08 NOTE — Progress Notes (Signed)
OT Cancellation Note  Patient Details Name: Ryan Serrano MRN: [id number] DOB: 1933-03-15   Cancelled Treatment:    Reason Eval/Treat Not Completed: OT screened, no needs identified, will sign off. Order received, chart reviewed. Per conversation c PT, pt back to baseline functional independence including donning belt in room. No skilled acute OT needs identified. Will sign off. Please re-consult if additional needs arise.   Dessie Coma, M.S. OTR/L  11/08/20, 2:24 PM  ascom [phone]

## 2020-11-08 NOTE — Evaluation (Signed)
Physical Therapy Evaluation Patient Details Name: Ryan Serrano MRN: [id number] DOB: [birth year] Today's Date: 11/08/2020   History of Present Illness  Pt is an 86 y/o M admitted on 11/07/20 after presenting to ED with c/o passing out. Pt reports sitting in chair & passed out 2-3 minutes, incontinent of bowels., followed by multiple episodes of emesis. CT of head was negative for acute abnormalities.  PMH: HTN, GERD, non insulin dependent DM, pancreatic mass followed by [facility] GI, renal cysts, heart murmur, stroke  Clinical Impression  Pt pleasant & agreeable to tx. Pt currently mod I for bed mobility & sit<>stand transfers. Pt is able to ambulate in hallway without AD & close supervision without significant LOB noted (1 small episode but pt able to correct with CGA). Pt reports no significant changes from baseline level of function. Currently recommend pt d/c home with intermittent supervision assist from son who he lives with. No recommendations for f/u PT.     Follow Up Recommendations No PT follow up    Equipment Recommendations  None recommended by PT    Recommendations for Other Services       Precautions / Restrictions Precautions Precautions: Fall Restrictions Weight Bearing Restrictions: No      Mobility  Bed Mobility Overal bed mobility: Modified Independent             General bed mobility comments: supine<>sit mod I with HOB elevated    Transfers Overall transfer level: Modified independent                  Ambulation/Gait Ambulation/Gait assistance: Supervision Gait Distance (Feet): 150 Feet Assistive device: None Gait Pattern/deviations: Decreased stride length Gait velocity: decreased   General Gait Details: 1 episode of mild LOB with CGA to correct  Stairs            Wheelchair Mobility    Modified Rankin (Stroke Patients Only)       Balance Overall balance assessment: Needs assistance           Standing balance-Leahy Scale:  Good Standing balance comment: close supervision gait without AD                             Pertinent Vitals/Pain Pain Assessment:  (c/o unrated soreness on old cut on R ring finger - nurse made aware of pt's request for band aid)    Home Living Family/patient expects to be discharged to:: Private residence Living Arrangements: Children Available Help at Discharge: Family;Available 24 hours/day (lives with son who works on farm home [place] located on) Type of Home: House Home Access: Stairs to enter Entrance Stairs-Rails: Psychiatric nurse of Steps: [place]: One level        Prior Function Level of Independence: Independent         Comments: works on Psychiatric nurse, 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     [employer]        Extremity/Trunk Assessment   Upper Extremity Assessment Upper Extremity Assessment: Overall WFL for tasks assessed    Lower Extremity Assessment Lower Extremity Assessment: Overall WFL for tasks assessed       Communication      Cognition Arousal/Alertness: Awake/alert Behavior During Therapy: WFL for tasks assessed/performed Overall Cognitive Status: Within Functional Limits for tasks assessed  General Comments: pleasant throughout session      General Comments General comments (skin integrity, edema, etc.): SpO2 >/= 88% throughout session on room air, no c/o adverse sx during session    Exercises     Assessment/Plan    PT Assessment Patent does not need any further PT services  PT Problem List         PT Treatment Interventions      PT Goals (Current goals can be found in the Care Plan section)  Acute Rehab PT Goals Patient Stated Goal: go home PT Goal Formulation: With patient Time For Goal Achievement: 11/22/20 Potential to Achieve Goals: Good    Frequency     Barriers to discharge        Co-evaluation               AM-PAC PT "6 Clicks"  Mobility  Outcome Measure Help needed turning from your back to your side while in a flat bed without using bedrails?: None Help needed moving from lying on your back to sitting on the side of a flat bed without using bedrails?: None Help needed moving to and from a bed to a chair (including a wheelchair)?: None Help needed standing up from a chair using your arms (e.g., wheelchair or bedside chair)?: None Help needed to walk in hospital room?: None Help needed climbing 3-5 steps with a railing? : A Little 6 Click Score: 23    End of Session   Activity Tolerance: Patient tolerated treatment well Patient left: in bed;with call bell/phone within reach Nurse Communication: Mobility status      Time: 9675-9163 PT Time Calculation (min) (ACUTE ONLY): 12 min   Charges:   PT Evaluation $PT Eval Low Complexity: 1 Low          Lavone Nian, PT, DPT 11/08/20, 2:07 PM   Waunita Schooner 11/08/2020, 2:06 PM

## 2020-11-08 NOTE — ED Notes (Signed)
Son at bedside.

## 2020-11-08 NOTE — Discharge Summary (Signed)
Physician Discharge Summary  Ryan Serrano ZOX:[id number] DOB: 1933/08/29 DOA: 11/07/2020  PCP: Weber Cooks, MD  Admit date: 11/07/2020 Discharge date: 11/08/2020  Admitted From: Home Disposition:  Home  Discharge Condition:Stable CODE STATUS:FULL Diet recommendation: Heart Healthy Brief/Interim Summary: Patient is 86 year old male with history of hypertension, GERD, non-insulin-dependent diabetes mellitus, pancreatic cancer followed by [facility], renal cyst who presents to the emergency room with complaints of passing out.  He was sitting in the chair and passed out for 2 to 3 minutes had bowel incontinence while passing out.  There was no witness of jerking motions or seizure activities.  He did not have any history of seizures.  Patient was admitted for the management of syncope work-up. His syncope work-up was largely negative.  Echo showed normal ejection fraction, grade 1 diastolic dysfunction.  Orthostatic vitals were negative.  EKG showed normal sinus rhythm without any arrhythmia.  He was seen by PT/OT and did not recommend any follow-up.  He is medically stable for discharge home today.  Following problems were addressed during his hospitalization:  Syncope: CT head did not show any acute intracranial abnormality. CT abdomen did not show any acute pathology, showed 2.1 cm complex cyst on the left lower kidney.  Chest x-ray showed chronic interstitial opacities. CT PE has been ordered but I do not think this is necessary.  Echocardiogram showed ejection fraction 65 to 81%, grade 1 diastolic dysfunction, no regional wall motion normality.  Carotid Doppler did not show any significant stenosis.  Orthostatic vitals were negative. Syncope could be vasovagal.  His blood pressure was noted to be soft on presentation.    Hypertension: Blood pressure was soft on presentation.  Improved with IV fluids.  Takes lisinopril and amlodipine at home.  His blood pressure is stable without these  medications.  I will recommend to stop these medications and monitor his blood pressure at home.  If necessary, he can restart this medication during the follow-up with his PCP.  AKI: Resolved with IV fluids  Non-insulin-dependent diabetes mellitus: Takes Metformin at home.    Hemoglobin A1c of 6.6  Hyperlipidemia: Continue statin  GERD: Continue Protonix   Discharge Diagnoses:  Principal Problem:   Syncope Active Problems:   Diverticulosis   Essential hypertension   Diabetes mellitus type 2, noninsulin dependent ([place])   Chronic GERD   Enlarged prostate   AKI (acute kidney injury) [facility])    Discharge Instructions  Discharge Instructions    Diet - low sodium heart healthy   Complete by: As directed    Discharge instructions   Complete by: As directed    1)Please follow up with your PCP in a week 2)Monitor your blood pressure at home   Increase activity slowly   Complete by: As directed      Allergies as of 11/08/2020      Reactions   Hydrochlorothiazide    Other reaction(s): hyponatremia-stopped by cards   Penicillins Anaphylaxis, Other (See Comments)   Atorvastatin Other (See Comments)   Other reaction(s): myalgias 12-06   Bee Venom Other (See Comments)   Hydrocodone Nausea And Vomiting   Hydrocodone-acetaminophen Nausea And Vomiting      Medication List    STOP taking these medications   amLODipine 5 MG tablet Commonly known as: NORVASC   lisinopril 40 MG tablet Commonly known as: ZESTRIL   Suprep Bowel Prep Kit 17.5-3.13-1.6 GM/177ML Soln Generic drug: Na Sulfate-K Sulfate-Mg Sulf     TAKE these medications   acetaminophen 650 MG CR tablet  Commonly known as: TYLENOL Take 1,300 mg by mouth 2 (two) times daily.   aspirin EC 81 MG tablet Take 81 mg by mouth daily.   diclofenac sodium 1 % Gel Commonly known as: VOLTAREN Apply 2 g topically as needed.   ferrous gluconate 240 (27 FE) MG tablet Commonly known as: FERGON Take 240 mg by mouth  every morning.   fluticasone 50 MCG/ACT nasal spray Commonly known as: FLONASE 1 spray by Each Nare route daily.   gabapentin 600 MG tablet Commonly known as: NEURONTIN Take 600 mg by mouth 3 (three) times daily.   metFORMIN 500 MG 24 hr tablet Commonly known as: GLUCOPHAGE-XR Take 500 mg by mouth 2 (two) times daily.   MULTIVITAMIN ADULTS 50+ PO Take 1 tablet by mouth daily.   NON FORMULARY Take 2 capsules by mouth 2 (two) times daily. Omega XL takes bid   pantoprazole 40 MG tablet Commonly known as: PROTONIX Take 40 mg by mouth daily.   simvastatin 20 MG tablet Commonly known as: ZOCOR Take 20 mg by mouth daily at 6 PM.   tamsulosin 0.4 MG Caps capsule Commonly known as: FLOMAX Take 0.4 mg by mouth daily.   True Metrix Blood Glucose Test test strip Generic drug: glucose blood   True Metrix Meter w/Device Kit   vitamin B-12 1000 MCG tablet Commonly known as: CYANOCOBALAMIN Take 1,000 mcg by mouth daily.   [facility] Thin Lancets Misc Apply 1 each topically daily.       Follow-up Information    Weber Cooks, MD. Schedule an appointment as soon as possible for a visit in 1 week(s).   Specialty: Family Medicine Contact information: [address] [place] [address] [phone]              Allergies  Allergen Reactions  . Hydrochlorothiazide     Other reaction(s): hyponatremia-stopped by cards  . Penicillins Anaphylaxis and Other (See Comments)  . Atorvastatin Other (See Comments)    Other reaction(s): myalgias 12-06   . Bee Venom Other (See Comments)  . Hydrocodone Nausea And Vomiting  . Hydrocodone-Acetaminophen Nausea And Vomiting    Consultations:  None   Procedures/Studies: CT Head Wo Contrast  Result Date: 11/07/2020 CLINICAL DATA:  Nausea vomiting dizziness EXAM: CT HEAD WITHOUT CONTRAST TECHNIQUE: Contiguous axial images were obtained from the base of the skull through the vertex  without intravenous contrast. COMPARISON:  None. FINDINGS: Brain: No acute territorial infarction, hemorrhage or intracranial mass. Mild atrophy. Mild hypodensity in the white matter consistent with chronic small vessel ischemic change. The ventricles are nonenlarged Vascular: No hyperdense vessels.  Carotid vascular calcification Skull: Normal. Negative for fracture or focal lesion. Sinuses/Orbits: Mucosal thickening in the sinuses Other: None IMPRESSION: 1. No CT evidence for acute intracranial abnormality. 2. Atrophy and mild chronic small vessel ischemic changes of the white matter. Electronically Signed   By: Donavan Foil M.D.   On: 11/07/2020 23:12   CT ABDOMEN PELVIS W CONTRAST  Result Date: 11/07/2020 CLINICAL DATA:  Nausea vomiting and abdominal pain EXAM: CT ABDOMEN AND PELVIS WITH CONTRAST TECHNIQUE: Multidetector CT imaging of the abdomen and pelvis was performed using the standard protocol following bolus administration of intravenous contrast. CONTRAST:  134m OMNIPAQUE IOHEXOL 300 MG/ML  SOLN COMPARISON:  CT 12/17/2009, 01/25/2005 FINDINGS: Lower chest: Lung bases demonstrate no acute consolidation or effusion. Coronary vascular calcification. Moderate hiatal hernia. Hepatobiliary: Subcentimeter hypodensities in the liver too small to further characterize. No calcified gallstone  or biliary dilatation Pancreas: Unremarkable. No pancreatic ductal dilatation or surrounding inflammatory changes. Spleen: Normal in size without focal abnormality. Adrenals/Urinary Tract: Adrenal glands are unremarkable. Kidneys are normal, without renal calculi, focal lesion, or hydronephrosis. Bladder is unremarkable. Stomach/Bowel: Adrenal glands are normal. Kidneys show no hydronephrosis. Complex cyst lower pole right kidney measuring up to 2.1 cm with increased peripheral calcification compared to prior but no significant increase in size. The urinary bladder is unremarkable. Diffuse diverticular disease of the  colon with more heavily concentrated diverticula at the sigmoid colon. Vascular/Lymphatic: Extensive aortic atherosclerosis. No aneurysm. No suspicious nodes Reproductive: Enlarged prostate with mass effect on the bladder. Other: No free air or free fluid. Musculoskeletal: Degenerative changes of the spine. No acute or suspicious osseous abnormality IMPRESSION: 1. No CT evidence for acute intra-abdominal or pelvic abnormality. 2. Diffuse diverticular disease of the colon without acute inflammatory process. 3. Enlarged prostate with mass effect on the bladder. 4. 2.1 cm complex cyst lower pole right kidney without significant change in size compared to prior, though now with increased peripheral or wall calcification which is coarse and nodular. Lesion is presumed benign given lack of significant interval change as compared with CT from 2011. 5. Moderate hiatal hernia. Aortic Atherosclerosis (ICD10-I70.0). Electronically Signed   By: Donavan Foil M.D.   On: 11/07/2020 23:23   US Carotid Bilateral  Result Date: 11/08/2020 CLINICAL DATA:  Syncope, hypertension and diabetes. EXAM: BILATERAL CAROTID DUPLEX ULTRASOUND TECHNIQUE: Pearline Cables scale imaging, color Doppler and duplex ultrasound were performed of bilateral carotid and vertebral arteries in the neck. COMPARISON:  None. FINDINGS: Criteria: Quantification of carotid stenosis is based on velocity parameters that correlate the residual internal carotid diameter with NASCET-based stenosis levels, using the diameter of the distal internal carotid lumen as the denominator for stenosis measurement. The following velocity measurements were obtained: RIGHT ICA:  65/18 cm/sec CCA:  57/32 cm/sec SYSTOLIC ICA/CCA RATIO:  0.8 ECA:  72 cm/sec LEFT ICA:  107/24 cm/sec CCA:  20/25 cm/sec SYSTOLIC ICA/CCA RATIO:  1.3 ECA:  73 cm/sec RIGHT CAROTID ARTERY: Mild amount of scattered calcified plaque is present at the level of the carotid bulb and proximal right ICA. Estimated right  ICA stenosis is less than 50%. RIGHT VERTEBRAL ARTERY: The right vertebral artery was very poorly visualized by ultrasound. There does appear to be some antegrade flow present. LEFT CAROTID ARTERY: Moderate amount of calcified plaque is present at the level of the distal bulb and extending to the proximal left ICA. Estimated left ICA stenosis is less than 50%. LEFT VERTEBRAL ARTERY: Antegrade flow with normal waveform and velocity. IMPRESSION: 1. Calcified plaque at the level of both carotid bulbs and proximal internal carotid arteries, left greater than right. No significant carotid stenosis identified with estimated bilateral ICA stenoses of less than 50%. 2. Poor visualization of the right vertebral artery with probable antegrade flow present. The left vertebral artery demonstrates antegrade flow with normal waveform. Electronically Signed   By: Aletta Edouard M.D.   On: 11/08/2020 14:20   DG Chest Port 1 View  Result Date: 11/08/2020 CLINICAL DATA:  Dizziness and vomiting EXAM: PORTABLE CHEST 1 VIEW COMPARISON:  12/17/2009 FINDINGS: Bilateral moderate interstitial opacity, likely chronic. No pleural effusion or pneumothorax. Normal cardiomediastinal contours. IMPRESSION: Chronic interstitial opacities without acute airspace disease. Electronically Signed   By: Ulyses Jarred M.D.   On: 11/08/2020 00:42   ECHOCARDIOGRAM COMPLETE  Result Date: 11/08/2020    ECHOCARDIOGRAM REPORT   Patient Name:   Ryan Serrano  Date of Exam: 11/08/2020 Medical Rec #:  [id number]    Height:       68.0 in Accession #:    [id number]   Weight:       154.0 lb Date of Birth:  1932/12/07   BSA:          1.829 m Patient Age:    45 years     BP:           114/60 mmHg Patient Gender: M            HR:           66 bpm. Exam Location:  [facility] Procedure: 2D Echo and Intracardiac Opacification Agent Indications:     Syncope  History:         Patient has no prior history of Echocardiogram examinations.                  Stroke; Risk  Factors:Hypertension and Diabetes.  Sonographer:     L Thornton-Maynard Referring Phys:  [id number] AMY N COX Diagnosing Phys: Bartholome Bill MD IMPRESSIONS  1. Left ventricular ejection fraction, by estimation, is 65 to 70%. Left ventricular ejection fraction by PLAX is 71 %. The left ventricle has normal function. The left ventricle has no regional wall motion abnormalities. There is mild left ventricular hypertrophy. Left ventricular diastolic parameters are consistent with Grade I diastolic dysfunction (impaired relaxation).  2. Right ventricular systolic function is normal. The right ventricular size is mildly enlarged. There is normal pulmonary artery systolic pressure.  3. The mitral valve is grossly normal. Mild mitral valve regurgitation.  4. The aortic valve was not well visualized. Aortic valve regurgitation is trivial. FINDINGS  Left Ventricle: Left ventricular ejection fraction, by estimation, is 65 to 70%. Left ventricular ejection fraction by PLAX is 71 %. The left ventricle has normal function. The left ventricle has no regional wall motion abnormalities. Definity contrast agent was given IV to delineate the left ventricular endocardial borders. The left ventricular internal cavity size was normal in size. There is mild left ventricular hypertrophy. Left ventricular diastolic parameters are consistent with Grade I diastolic dysfunction (impaired relaxation). Right Ventricle: The right ventricular size is mildly enlarged. No increase in right ventricular wall thickness. Right ventricular systolic function is normal. There is normal pulmonary artery systolic pressure. The tricuspid regurgitant velocity is 2.47  m/s, and with an assumed right atrial pressure of 3 mmHg, the estimated right ventricular systolic pressure is 70.6 mmHg. Left Atrium: Left atrial size was normal in size. Right Atrium: Right atrial size was normal in size. Pericardium: There is no evidence of pericardial effusion. Mitral Valve: The  mitral valve is grossly normal. Mild mitral valve regurgitation. Tricuspid Valve: The tricuspid valve is grossly normal. Tricuspid valve regurgitation is trivial. Aortic Valve: The aortic valve was not well visualized. Aortic valve regurgitation is trivial. Aortic valve mean gradient measures 4.0 mmHg. Aortic valve peak gradient measures 6.7 mmHg. Aortic valve area, by VTI measures 2.00 cm. Pulmonic Valve: The pulmonic valve was not well visualized. Pulmonic valve regurgitation is trivial. Aorta: The aortic root is normal in size and structure. IAS/Shunts: The interatrial septum was not assessed.  LEFT VENTRICLE PLAX 2D LV EF:         Left            Diastology                ventricular     LV e' medial:    5.98  cm/s                ejection        LV E/e' medial:  12.8                fraction by     LV e' lateral:   6.96 cm/s                PLAX is 71      LV E/e' lateral: 11.0                %. LVIDd:         5.11 cm LVIDs:         3.03 cm LV PW:         1.02 cm LV IVS:        1.23 cm LVOT diam:     2.10 cm LV SV:         58 LV SV Index:   32 LVOT Area:     3.46 cm  RIGHT VENTRICLE RV S prime:     14.50 cm/s TAPSE (M-mode): 2.9 cm LEFT ATRIUM             Index LA diam:        3.60 cm 1.97 cm/m LA Vol (A2C):   59.5 ml 32.53 ml/m LA Vol (A4C):   65.7 ml 35.92 ml/m LA Biplane Vol: 64.0 ml 34.99 ml/m  AORTIC VALVE                   PULMONIC VALVE AV Area (Vmax):    2.30 cm    PV Vmax:       0.95 m/s AV Area (Vmean):   2.14 cm    PV Peak grad:  3.6 mmHg AV Area (VTI):     2.00 cm AV Vmax:           129.00 cm/s AV Vmean:          94.400 cm/s AV VTI:            0.291 m AV Peak Grad:      6.7 mmHg AV Mean Grad:      4.0 mmHg LVOT Vmax:         85.50 cm/s LVOT Vmean:        58.200 cm/s LVOT VTI:          0.168 m LVOT/AV VTI ratio: 0.58  AORTA Ao Root diam: 3.00 cm MITRAL VALVE                TRICUSPID VALVE MV Area (PHT): 1.62 cm     TR Peak grad:   24.4 mmHg MV E velocity: 76.30 cm/s   TR Vmax:        247.00  cm/s MV A velocity: 116.00 cm/s MV E/A ratio:  0.66         SHUNTS                             Systemic VTI:  0.17 m                             Systemic Diam: 2.10 cm Bartholome Bill MD Electronically signed by Bartholome Bill MD Signature Date/Time: 11/08/2020/10:23:13 AM    Final       Subjective: Patient seen and examined at the bedside this morning.  Hemodynamically stable for  discharge.  Discharge Exam: Vitals:   11/08/20 1400 11/08/20 1430  BP: (!) 131/48 123/89  Pulse: 84 80  Resp: (!) 25 (!) 24  Temp:    SpO2: 94% 95%   Vitals:   11/08/20 1000 11/08/20 1200 11/08/20 1400 11/08/20 1430  BP: 129/72 (!) 110/59 (!) 131/48 123/89  Pulse: (!) 150 64 84 80  Resp: 18 18 (!) 25 (!) 24  Temp:      TempSrc:      SpO2: 98% 94% 94% 95%  Weight:      Height:        General: Pt is alert, awake, not in acute distress Cardiovascular: RRR, S1/S2 +, no rubs, no gallops Respiratory: CTA bilaterally, no wheezing, no rhonchi Abdominal: Soft, NT, ND, bowel sounds + Extremities: no edema, no cyanosis    The results of significant diagnostics from this hospitalization (including imaging, microbiology, ancillary and laboratory) are listed below for reference.     Microbiology: Recent Results (from the past 240 hour(s))  Resp Panel by RT-PCR (Flu A&B, Covid) Nasopharyngeal Swab     Status: None   Collection Time: 11/07/20 11:53 PM   Specimen: Nasopharyngeal Swab; Nasopharyngeal(NP) swabs in vial transport medium  Result Value Ref Range Status   SARS Coronavirus 2 by RT PCR NEGATIVE NEGATIVE Final    Comment: (NOTE) SARS-CoV-2 target nucleic acids are NOT DETECTED.  The SARS-CoV-2 RNA is generally detectable in upper respiratory specimens during the acute phase of infection. The lowest concentration of SARS-CoV-2 viral copies this assay can detect is 138 copies/mL. A negative result does not preclude SARS-Cov-2 infection and should not be used as the sole basis for treatment or other  patient management decisions. A negative result may occur with  improper specimen collection/handling, submission of specimen other than nasopharyngeal swab, presence of viral mutation(s) within the areas targeted by this assay, and inadequate number of viral copies(<138 copies/mL). A negative result must be combined with clinical observations, patient history, and epidemiological information. The expected result is Negative.  Fact Sheet for Patients:  EntrepreneurPulse.com.au  Fact Sheet for Healthcare Providers:  IncredibleEmployment.be  This test is no t yet approved or cleared by the [place] FDA and  has been authorized for detection and/or diagnosis of SARS-CoV-2 by FDA under an Emergency Use Authorization (EUA). This EUA will remain  in effect (meaning this test can be used) for the duration of the COVID-19 declaration under Section 564(b)(1) of the Act, 21 U.S.C.section 360bbb-3(b)(1), unless the authorization is terminated  or revoked sooner.       Influenza A by PCR NEGATIVE NEGATIVE Final   Influenza B by PCR NEGATIVE NEGATIVE Final    Comment: (NOTE) The Xpert Xpress SARS-CoV-2/FLU/RSV plus assay is intended as an aid in the diagnosis of influenza from Nasopharyngeal swab specimens and should not be used as a sole basis for treatment. Nasal washings and aspirates are unacceptable for Xpert Xpress SARS-CoV-2/FLU/RSV testing.  Fact Sheet for Patients: EntrepreneurPulse.com.au  Fact Sheet for Healthcare Providers: IncredibleEmployment.be  This test is not yet approved or cleared by the [place] FDA and has been authorized for detection and/or diagnosis of SARS-CoV-2 by FDA under an Emergency Use Authorization (EUA). This EUA will remain in effect (meaning this test can be used) for the duration of the COVID-19 declaration under Section 564(b)(1) of the Act, 21 U.S.C. section  360bbb-3(b)(1), unless the authorization is terminated or revoked.  Performed at [facility], [address], [place], [place] [address]   CULTURE,  BLOOD (ROUTINE X 2) w Reflex to ID Panel     Status: None (Preliminary result)   Collection Time: 11/08/20  1:35 AM   Specimen: Peripheral; Blood  Result Value Ref Range Status   Specimen Description BLOOD BLOOD LEFT FOREARM  Final   Special Requests   Final    BOTTLES DRAWN AEROBIC AND ANAEROBIC Blood Culture adequate volume   Culture   Final    NO GROWTH < 12 HOURS Performed at [facility], [address] [place], [place] [address]    Report Status PENDING  Incomplete     Labs: BNP (last 3 results) Recent Labs    11/07/20 1714  BNP 03.7   Basic Metabolic Panel: Recent Labs  Lab 11/07/20 1714 11/08/20 1329  NA 141 142  K 4.2 4.3  CL 107 111  CO2 22 25  GLUCOSE 129* 88  BUN 15 13  CREATININE 1.49* 1.10  CALCIUM 8.5* 8.6*   Liver Function Tests: Recent Labs  Lab 11/07/20 1714  AST 18  ALT 10  ALKPHOS 48  BILITOT 0.7  PROT 6.9  ALBUMIN 3.7   Recent Labs  Lab 11/07/20 1714  LIPASE 35   No results for input(s): AMMONIA in the last 168 hours. CBC: Recent Labs  Lab 11/07/20 1714  WBC 10.5  HGB 12.3*  HCT 38.6*  MCV 95.1  PLT 126*   Cardiac Enzymes: No results for input(s): CKTOTAL, CKMB, CKMBINDEX, TROPONINI in the last 168 hours. BNP: Invalid input(s): POCBNP CBG: Recent Labs  Lab 11/08/20 0121 11/08/20 0629 11/08/20 0857 11/08/20 1204  GLUCAP 87 89 78 149*   D-Dimer No results for input(s): DDIMER in the last 72 hours. Hgb A1c Recent Labs    11/08/20 0135  HGBA1C 6.6*   Lipid Profile No results for input(s): CHOL, HDL, LDLCALC, TRIG, CHOLHDL, LDLDIRECT in the last 72 hours. Thyroid function studies Recent Labs    11/07/20 1946  TSH 0.947   Anemia work up No results for input(s): VITAMINB12, FOLATE, FERRITIN, TIBC, IRON, RETICCTPCT in the last 72  hours. Urinalysis    Component Value Date/Time   COLORURINE COLORLESS (A) 11/07/2020 2353   APPEARANCEUR CLEAR (A) 11/07/2020 2353   LABSPEC 1.013 11/07/2020 2353   PHURINE 6.0 11/07/2020 2353   GLUCOSEU NEGATIVE 11/07/2020 2353   HGBUR NEGATIVE 11/07/2020 2353   [place] NEGATIVE 11/07/2020 2353   KETONESUR NEGATIVE 11/07/2020 2353   PROTEINUR NEGATIVE 11/07/2020 2353   NITRITE NEGATIVE 11/07/2020 2353   LEUKOCYTESUR NEGATIVE 11/07/2020 2353   Sepsis Labs Invalid input(s): PROCALCITONIN,  WBC,  LACTICIDVEN Microbiology Recent Results (from the past 240 hour(s))  Resp Panel by RT-PCR (Flu A&B, Covid) Nasopharyngeal Swab     Status: None   Collection Time: 11/07/20 11:53 PM   Specimen: Nasopharyngeal Swab; Nasopharyngeal(NP) swabs in vial transport medium  Result Value Ref Range Status   SARS Coronavirus 2 by RT PCR NEGATIVE NEGATIVE Final    Comment: (NOTE) SARS-CoV-2 target nucleic acids are NOT DETECTED.  The SARS-CoV-2 RNA is generally detectable in upper respiratory specimens during the acute phase of infection. The lowest concentration of SARS-CoV-2 viral copies this assay can detect is 138 copies/mL. A negative result does not preclude SARS-Cov-2 infection and should not be used as the sole basis for treatment or other patient management decisions. A negative result may occur with  improper specimen collection/handling, submission of specimen other than nasopharyngeal swab, presence of viral mutation(s) within the areas targeted by this assay, and inadequate number of viral  copies(<138 copies/mL). A negative result must be combined with clinical observations, patient history, and epidemiological information. The expected result is Negative.  Fact Sheet for Patients:  EntrepreneurPulse.com.au  Fact Sheet for Healthcare Providers:  IncredibleEmployment.be  This test is no t yet approved or cleared by the [place] FDA and   has been authorized for detection and/or diagnosis of SARS-CoV-2 by FDA under an Emergency Use Authorization (EUA). This EUA will remain  in effect (meaning this test can be used) for the duration of the COVID-19 declaration under Section 564(b)(1) of the Act, 21 U.S.C.section 360bbb-3(b)(1), unless the authorization is terminated  or revoked sooner.       Influenza A by PCR NEGATIVE NEGATIVE Final   Influenza B by PCR NEGATIVE NEGATIVE Final    Comment: (NOTE) The Xpert Xpress SARS-CoV-2/FLU/RSV plus assay is intended as an aid in the diagnosis of influenza from Nasopharyngeal swab specimens and should not be used as a sole basis for treatment. Nasal washings and aspirates are unacceptable for Xpert Xpress SARS-CoV-2/FLU/RSV testing.  Fact Sheet for Patients: EntrepreneurPulse.com.au  Fact Sheet for Healthcare Providers: IncredibleEmployment.be  This test is not yet approved or cleared by the [place] FDA and has been authorized for detection and/or diagnosis of SARS-CoV-2 by FDA under an Emergency Use Authorization (EUA). This EUA will remain in effect (meaning this test can be used) for the duration of the COVID-19 declaration under Section 564(b)(1) of the Act, 21 U.S.C. section 360bbb-3(b)(1), unless the authorization is terminated or revoked.  Performed at [facility], [address] [place], [place] [address]   CULTURE, BLOOD (ROUTINE X 2) w Reflex to ID Panel     Status: None (Preliminary result)   Collection Time: 11/08/20  1:35 AM   Specimen: Peripheral; Blood  Result Value Ref Range Status   Specimen Description BLOOD BLOOD LEFT FOREARM  Final   Special Requests   Final    BOTTLES DRAWN AEROBIC AND ANAEROBIC Blood Culture adequate volume   Culture   Final    NO GROWTH < 12 HOURS Performed at [facility], [address], [place], [place] [address]    Report Status PENDING  Incomplete    Please  note: You were cared for by a hospitalist during your hospital stay. Once you are discharged, your primary care physician will handle any further medical issues. Please note that NO REFILLS for any discharge medications will be authorized once you are discharged, as it is imperative that you return to your primary care physician (or establish a relationship with a primary care physician if you do not have one) for your post hospital discharge needs so that they can reassess your need for medications and monitor your lab values.    Time coordinating discharge: 40 minutes  SIGNED:   Shelly Coss, MD  [facility] 11/08/2020, 3:17 PM Pager [phone]  If 7PM-7AM, please contact night-coverage [email or web address] Password [email or web address]

## 2020-11-08 NOTE — ED Notes (Signed)
Provider aware of cardiac monitor assessment. Pt in NSR and going in and out of ventricular bigeminy. Pt denies chest pain. Pt currently resting in bed.

## 2020-11-08 NOTE — ED Notes (Signed)
Printed consent for discharge signed.

## 2020-11-08 NOTE — ED Notes (Signed)
Informed admitting MD Adhikari of completed orthostatics per order at this time

## 2020-11-08 NOTE — ED Notes (Signed)
Pt resting in bed in NAD at this time.  Pt requested and given cup of water and CBG checked at this time.  Call light and urinal at bedside.  Pt denies any other needs at this time.

## 2020-11-13 LAB — CULTURE, BLOOD (ROUTINE X 2)
Culture: NO GROWTH
Special Requests: ADEQUATE

## 2020-11-20 NOTE — Progress Notes (Deleted)
[facility]  [address], [address] [place], [place] [address] Phone: [phone]  Fax: [phone]   Clinic Day:  11/20/2020   Referring physician: Weber Cooks, MD  Chief Complaint: Ryan Serrano is a 86 y.o. male with a monoclonal gammopathy and iron deficiency anemia who is seen for 6 month assessment.  HPI: The patient was last seen in the hematology clinic on 05/21/2020. At that time, he noted dark stool on oral iron.  Exam was unremarkable. Hematocrit was 40.2, hemoglobin 13.3, platelets 119,000, WBC 6,400. CMP was normal except for a glucose of 131. Ferritin was 34 with an iron saturation of 17% and a TIBC of 314. M spike was 0.4 gm/dL.  The patient saw Dr. Allen Norris on 07/23/2020. He reported stool incontinence and frequent diarrhea. Colonoscopy on 08/04/2020 showed diverticulosis in the entire examined colon. There were non-bleeding internal hemorrhoids. No specimens were collected. Upper endoscopy revealed a small hiatal hernia and a single 4 mm sessile gastric polyp.  Pathology revealed a hyperplastic polyp with intestinal metaplasia and no evidence of malignancy.  The patient was admitted to [facility] from 11/07/2020-11/08/2020. He reported waves of abdominal pain and one episode of syncope. He received fluids and ondansetron. Abdomen and pelvis CT revealed no acute abnormality.  Head CT showed no acute intracranial abnormality. Echo revealed an EF of 65-70%. Carotid doppler did not show any significant stenosis. It was felt that his syncope could have been vasovagal.  Labs on 08/21/2020 revealed a hematocrit of 40.2, hemoglobin 13.4, platelets 150,000, WBC 7,600. Ferritin was 54.  During the interim, ***   Past Medical History:  Diagnosis Date  . Diabetes mellitus without complication ([place])   . Diverticulitis   . GERD (gastroesophageal reflux disease)   . Heart murmur   . High cholesterol   . Hypertension   . Kidney stones   . Stroke [facility]) 2008  .  Wears dentures    Full upper and lower    Past Surgical History:  Procedure Laterality Date  . BIOPSY N/A 08/04/2020   Procedure: BIOPSY;  Surgeon: Lucilla Lame, MD;  Location: [place];  Service: Endoscopy;  Laterality: N/A;  . COLONOSCOPY WITH PROPOFOL N/A 08/04/2020   Procedure: COLONOSCOPY WITH PROPOFOL;  Surgeon: Lucilla Lame, MD;  Location: [place];  Service: Endoscopy;  Laterality: N/A;  . ESOPHAGOGASTRODUODENOSCOPY (EGD) WITH PROPOFOL N/A 08/04/2020   Procedure: ESOPHAGOGASTRODUODENOSCOPY (EGD) WITH PROPOFOL;  Surgeon: Lucilla Lame, MD;  Location: [place];  Service: Endoscopy;  Laterality: N/A;  Diabetic - oral meds  . ESOPHAGUS SURGERY    . FINGER AMPUTATION Left   . HEMORRHOID BANDING    . HERNIA REPAIR    . KIDNEY STONE SURGERY      Family History  Problem Relation Age of Onset  . Hypertension Mother   . Cancer Father   . Diabetes Father   . Hypertension Father     Social History:  reports that he quit smoking about 42 years ago. His smoking use included cigarettes. He quit after 16.00 years of use. His smokeless tobacco use includes chew. He reports current alcohol use. He reports that he does not use drugs. He denies any known exposure to radiation or toxins. Hecurrently works farming soybeans, corn, and wheat. He [place] in [place].He was been married for 55 years and has 4 children.His wife died 2 years ago.  His daughter's name is [name] [phone]).He can be reached at [phone])  His nephew passed away.  The patient is alone***  today.  Allergies:  Allergies  Allergen Reactions  . Hydrochlorothiazide     Other reaction(s): hyponatremia-stopped by cards  . Penicillins Anaphylaxis and Other (See Comments)  . Atorvastatin Other (See Comments)    Other reaction(s): myalgias 12-06   . Bee Venom Other (See Comments)  . Hydrocodone Nausea And Vomiting  . Hydrocodone-Acetaminophen Nausea And Vomiting    Current  Medications: Current Outpatient Medications  Medication Sig Dispense Refill  . acetaminophen (TYLENOL) 650 MG CR tablet Take 1,300 mg by mouth 2 (two) times daily.    Marland Kitchen aspirin EC 81 MG tablet Take 81 mg by mouth daily.     . Blood Glucose Monitoring Suppl (TRUE METRIX METER) w/Device KIT     . diclofenac sodium (VOLTAREN) 1 % GEL Apply 2 g topically as needed.     . ferrous gluconate (FERGON) 240 (27 FE) MG tablet Take 240 mg by mouth every morning.    . fluticasone (FLONASE) 50 MCG/ACT nasal spray 1 spray by Each Nare route daily.    Marland Kitchen gabapentin (NEURONTIN) 600 MG tablet Take 600 mg by mouth 3 (three) times daily.    . metFORMIN (GLUCOPHAGE-XR) 500 MG 24 hr tablet Take 500 mg by mouth 2 (two) times daily.     . Multiple Vitamins-Minerals (MULTIVITAMIN ADULTS 50+ PO) Take 1 tablet by mouth daily.    . NON FORMULARY Take 2 capsules by mouth 2 (two) times daily. Omega XL takes bid    . pantoprazole (PROTONIX) 40 MG tablet Take 40 mg by mouth daily.    . simvastatin (ZOCOR) 20 MG tablet Take 20 mg by mouth daily at 6 PM.     . tamsulosin (FLOMAX) 0.4 MG CAPS capsule Take 0.4 mg by mouth daily.     . TRUE METRIX BLOOD GLUCOSE TEST test strip     . vitamin B-12 (CYANOCOBALAMIN) 1000 MCG tablet Take 1,000 mcg by mouth daily.    . [facility] Thin Lancets MISC Apply 1 each topically daily.     No current facility-administered medications for this visit.    Review of Systems  Constitutional: Positive for weight loss (3 lbs). Negative for chills, diaphoresis, fever and malaise/fatigue.       Feels "good".  HENT: Negative.  Negative for congestion, ear pain, hearing loss, nosebleeds, sinus pain and sore throat.   Eyes: Negative.  Negative for blurred vision, double vision and pain.  Respiratory: Negative.  Negative for cough, sputum production, shortness of breath and wheezing.   Cardiovascular: Negative.  Negative for chest pain, palpitations, orthopnea, leg swelling and PND.  Gastrointestinal:  Positive for diarrhea (often) and melena (on oral iron). Negative for abdominal pain, blood in stool, constipation, heartburn, nausea and vomiting.       Eating well. Reports that his bowels "have been leaking."  Genitourinary: Negative.  Negative for dysuria, frequency, hematuria and urgency.  Musculoskeletal: Positive for back pain (right sided) and joint pain (right hip, from fall years ago). Negative for myalgias.  Skin: Negative.  Negative for itching and rash.  Neurological: Positive for sensory change (diabetic neuropathy, stable). Negative for tingling, focal weakness, weakness and headaches.  Endo/Heme/Allergies: Negative.  Does not bruise/bleed easily.       Diabetes.  Psychiatric/Behavioral: Negative.  Negative for depression, memory loss and substance abuse. The patient is not nervous/anxious and does not have insomnia.   All other systems reviewed and are negative.  Performance status (ECOG): 0-1***  Vitals There were no vitals taken for this visit.   Physical  Exam Nursing note reviewed.  Constitutional:      General: He is not in acute distress.    Appearance: Normal appearance. He is not ill-appearing or diaphoretic.  HENT:     Head: Normocephalic and atraumatic.     Comments: Cap and mask.    Mouth/Throat:     Mouth: Mucous membranes are moist.     Pharynx: Oropharynx is clear. No oropharyngeal exudate or posterior oropharyngeal erythema.  Eyes:     General: No scleral icterus.    Extraocular Movements: Extraocular movements intact.     Conjunctiva/sclera: Conjunctivae normal.     Pupils: Pupils are equal, round, and reactive to light.     Comments: Bilateral arcus.  Cardiovascular:     Rate and Rhythm: Normal rate and regular rhythm.     Heart sounds: Normal heart sounds.  Pulmonary:     Breath sounds: Normal breath sounds.  Abdominal:     General: There is no distension.     Palpations: Abdomen is soft. There is no mass.     Tenderness: There is no abdominal  tenderness. There is no guarding or rebound.  Musculoskeletal:        General: No swelling, tenderness, deformity or signs of injury.     Cervical back: Normal range of motion and neck supple.  Lymphadenopathy:     Cervical: No cervical adenopathy.  Skin:    General: Skin is warm.     Coloration: Skin is not jaundiced or pale.     Findings: No erythema.  Neurological:     Mental Status: He is alert and oriented to person, place, and time.  Psychiatric:        Behavior: Behavior normal.        Thought Content: Thought content normal.        Judgment: Judgment normal.     No visits with results within 3 Day(s) from this visit.  Latest known visit with results is:  Admission on 11/07/2020, Discharged on 11/08/2020  Component Date Value Ref Range Status  . Lipase 11/07/2020 35  11 - 51 U/L Final   Performed at [facility], [address] [place], [place] [address]  . Sodium 11/07/2020 141  135 - 145 mmol/L Final  . Potassium 11/07/2020 4.2  3.5 - 5.1 mmol/L Final  . Chloride 11/07/2020 107  98 - 111 mmol/L Final  . CO2 11/07/2020 22  22 - 32 mmol/L Final  . Glucose, Bld 11/07/2020 129* 70 - 99 mg/dL Final   Glucose reference range applies only to samples taken after fasting for at least 8 hours.  . BUN 11/07/2020 15  8 - 23 mg/dL Final  . Creatinine, Ser 11/07/2020 1.49* 0.61 - 1.24 mg/dL Final  . Calcium 11/07/2020 8.5* 8.9 - 10.3 mg/dL Final  . Total Protein 11/07/2020 6.9  6.5 - 8.1 g/dL Final  . Albumin 11/07/2020 3.7  3.5 - 5.0 g/dL Final  . AST 11/07/2020 18  15 - 41 U/L Final  . ALT 11/07/2020 10  0 - 44 U/L Final  . Alkaline Phosphatase 11/07/2020 48  38 - 126 U/L Final  . Total Bilirubin 11/07/2020 0.7  0.3 - 1.2 mg/dL Final  . GFR, Estimated 11/07/2020 46* >60 mL/min Final   Comment: (NOTE) Calculated using the CKD-EPI Creatinine Equation (2021)   . Anion gap 11/07/2020 12  5 - 15 Final   Performed at [facility], [address]  [place], [place] [address]  . WBC 11/07/2020 10.5  4.0 - 10.5 K/uL Final  . RBC 11/07/2020 4.06* 4.22 - 5.81 MIL/uL Final  . Hemoglobin 11/07/2020 12.3* 13.0 - 17.0 g/dL Final  . HCT 11/07/2020 38.6* 39.0 - 52.0 % Final  . MCV 11/07/2020 95.1  80.0 - 100.0 fL Final  . MCH 11/07/2020 30.3  26.0 - 34.0 pg Final  . MCHC 11/07/2020 31.9  30.0 - 36.0 g/dL Final  . RDW 11/07/2020 13.2  11.5 - 15.5 % Final  . Platelets 11/07/2020 126* 150 - 400 K/uL Final  . nRBC 11/07/2020 0.0  0.0 - 0.2 % Final   Performed at [facility], [address], [place], [place] [address]  . Color, Urine 11/07/2020 COLORLESS* YELLOW Final  . APPearance 11/07/2020 CLEAR* CLEAR Final  . Specific Gravity, Urine 11/07/2020 1.013  1.005 - 1.030 Final  . pH 11/07/2020 6.0  5.0 - 8.0 Final  . Glucose, UA 11/07/2020 NEGATIVE  NEGATIVE mg/dL Final  . Hgb urine dipstick 11/07/2020 NEGATIVE  NEGATIVE Final  . Bilirubin Urine 11/07/2020 NEGATIVE  NEGATIVE Final  . Ketones, ur 11/07/2020 NEGATIVE  NEGATIVE mg/dL Final  . Protein, ur 11/07/2020 NEGATIVE  NEGATIVE mg/dL Final  . Nitrite 11/07/2020 NEGATIVE  NEGATIVE Final  . Chalmers Guest 11/07/2020 NEGATIVE  NEGATIVE Final  . WBC, UA 11/07/2020 NONE SEEN  0 - 5 WBC/hpf Final  . Bacteria, UA 11/07/2020 NONE SEEN  NONE SEEN Final  . Squamous Epithelial / LPF 11/07/2020 NONE SEEN  0 - 5 Final   Performed at [facility], [address], [place], [place] [address]  . Troponin I (High Sensitivity) 11/07/2020 7  <18 ng/L Final   Comment: (NOTE) Elevated high sensitivity troponin I (hsTnI) values and significant  changes across serial measurements may suggest ACS but many other  chronic and acute conditions are known to elevate hsTnI results.  Refer to the "Links" section for chest pain algorithms and additional  guidance. Performed at [facility], [address] [place], [place] [address]   . Troponin I (High Sensitivity) 11/07/2020 6  <18 ng/L Final    Comment: (NOTE) Elevated high sensitivity troponin I (hsTnI) values and significant  changes across serial measurements may suggest ACS but many other  chronic and acute conditions are known to elevate hsTnI results.  Refer to the "Links" section for chest pain algorithms and additional  guidance. Performed at [facility], [address], [place], [place] [address]   . SARS Coronavirus 2 by RT PCR 11/07/2020 NEGATIVE  NEGATIVE Final   Comment: (NOTE) SARS-CoV-2 target nucleic acids are NOT DETECTED.  The SARS-CoV-2 RNA is generally detectable in upper respiratory specimens during the acute phase of infection. The lowest concentration of SARS-CoV-2 viral copies this assay can detect is 138 copies/mL. A negative result does not preclude SARS-Cov-2 infection and should not be used as the sole basis for treatment or other patient management decisions. A negative result may occur with  improper specimen collection/handling, submission of specimen other than nasopharyngeal swab, presence of viral mutation(s) within the areas targeted by this assay, and inadequate number of viral copies(<138 copies/mL). A negative result must be combined with clinical observations, patient history, and epidemiological information. The expected result is Negative.  Fact Sheet for Patients:  EntrepreneurPulse.com.au  Fact Sheet for Healthcare Providers:  IncredibleEmployment.be  This test is no                          t yet approved or cleared  by the [place] and  has been authorized for detection and/or diagnosis of SARS-CoV-2 by FDA under an Emergency Use Authorization (EUA). This EUA will remain  in effect (meaning this test can be used) for the duration of the COVID-19 declaration under Section 564(b)(1) of the Act, 21 U.S.C.section 360bbb-3(b)(1), unless the authorization is terminated  or revoked sooner.      . Influenza A by PCR  11/07/2020 NEGATIVE  NEGATIVE Final  . Influenza B by PCR 11/07/2020 NEGATIVE  NEGATIVE Final   Comment: (NOTE) The Xpert Xpress SARS-CoV-2/FLU/RSV plus assay is intended as an aid in the diagnosis of influenza from Nasopharyngeal swab specimens and should not be used as a sole basis for treatment. Nasal washings and aspirates are unacceptable for Xpert Xpress SARS-CoV-2/FLU/RSV testing.  Fact Sheet for Patients: EntrepreneurPulse.com.au  Fact Sheet for Healthcare Providers: IncredibleEmployment.be  This test is not yet approved or cleared by the [place] FDA and has been authorized for detection and/or diagnosis of SARS-CoV-2 by FDA under an Emergency Use Authorization (EUA). This EUA will remain in effect (meaning this test can be used) for the duration of the COVID-19 declaration under Section 564(b)(1) of the Act, 21 U.S.C. section 360bbb-3(b)(1), unless the authorization is terminated or revoked.  Performed at [facility], [address], [place], [place] [address]   . Weight 11/08/2020 2,464  oz Final  . Height 11/08/2020 68  in Final  . BP 11/08/2020 129/90  mmHg Final  . Ao pk vel 11/08/2020 1.29  m/s Final  . AV Area VTI 11/08/2020 2.00  cm2 Final  . AR max vel 11/08/2020 2.30  cm2 Final  . AV Mean grad 11/08/2020 4.0  mmHg Final  . AV Peak grad 11/08/2020 6.7  mmHg Final  . S' Lateral 11/08/2020 3.03  cm Final  . AV Area mean vel 11/08/2020 2.14  cm2 Final  . Area-P 1/2 11/08/2020 1.62  cm2 Final  . TSH 11/07/2020 0.947  0.350 - 4.500 uIU/mL Final   Comment: Performed by a 3rd Generation assay with a functional sensitivity of <=0.01 uIU/mL. Performed at [facility], [address], [place], [place] [address]   . B Natriuretic Peptide 11/07/2020 58.4  0.0 - 100.0 pg/mL Final   Performed at [facility], [address] [place], [place] [address]  . Specimen Description 11/08/2020 BLOOD BLOOD  LEFT FOREARM   Final  . Special Requests 11/08/2020 BOTTLES DRAWN AEROBIC AND ANAEROBIC Blood Culture adequate volume   Final  . Culture 11/08/2020    Final                   Value:NO GROWTH 5 DAYS Performed at [facility], [address], [place],  [address]   . Report Status 11/08/2020 11/13/2020 FINAL   Final  . Hgb A1c MFr Bld 11/08/2020 6.6* 4.8 - 5.6 % Final   Comment: (NOTE) Pre diabetes:          5.7%-6.4%  Diabetes:              >6.4%  Glycemic control for   <7.0% adults with diabetes   . Mean Plasma Glucose 11/08/2020 142.72  mg/dL Final   Performed at [facility], [place] [address], [place],  [address]  . Glucose-Capillary 11/08/2020 87  70 - 99 mg/dL Final   Glucose reference range applies only to samples taken after fasting for at least 8 hours.  . Glucose-Capillary 11/08/2020 89  70 -  99 mg/dL Final   Glucose reference range applies only to samples taken after fasting for at least 8 hours.  . Glucose-Capillary 11/08/2020 78  70 - 99 mg/dL Final   Glucose reference range applies only to samples taken after fasting for at least 8 hours.  . Glucose-Capillary 11/08/2020 149* 70 - 99 mg/dL Final   Glucose reference range applies only to samples taken after fasting for at least 8 hours.  . Sodium 11/08/2020 142  135 - 145 mmol/L Final  . Potassium 11/08/2020 4.3  3.5 - 5.1 mmol/L Final  . Chloride 11/08/2020 111  98 - 111 mmol/L Final  . CO2 11/08/2020 25  22 - 32 mmol/L Final  . Glucose, Bld 11/08/2020 88  70 - 99 mg/dL Final   Glucose reference range applies only to samples taken after fasting for at least 8 hours.  . BUN 11/08/2020 13  8 - 23 mg/dL Final  . Creatinine, Ser 11/08/2020 1.10  0.61 - 1.24 mg/dL Final  . Calcium 11/08/2020 8.6* 8.9 - 10.3 mg/dL Final  . GFR, Estimated 11/08/2020 >60  >60 mL/min Final   Comment: (NOTE) Calculated using the CKD-EPI Creatinine Equation (2021)   . Anion gap 11/08/2020 6  5 - 15 Final   Performed  at [facility], [address] [place], [place] [address]    Assessment:  Evans Levee is a 86 y.o. male withiron deficiencyand an IgA monoclonal gammopathy. Hemoglobinhas ranged from 11.2 - 11.5.  [facility] labs from 09/26/2018 - 03/05/2020revealed following normal studies: B12, LDH, Coom[place]. MMA was 17.8 (0.3 - 2.8).  Work-up on 07/04/2019:hematocrit 39.1, hemoglobin 12.2, MCV 89.7, platelets 137,000, WBC 7,400 with a normal differential.Normal studiesincluded: creatinine (1.04), calcium (9.1), LFTs, haptoglobin, LDH, folate, and TSH. Ferritinwas 12 with an iron saturation 15%and a TIBC of 395.SPEPrevealed a 0.4gm/dL IgA monoclonal protein with kappa light chain specificity.IgG was878, IgA 527(61-437),andIgM 44.  Kappa free light chains 74, lambda light chains 25.3 and ratio 2.92 (0.26 - 1.65) on 07/25/2019.  Beta-2 microglobulin was 2.0 (normal).  24-hour UPEP revealed no monoclonal protein with kappa/lambda free light chain ratio of > 15.72 (1.03 - 31.76).  SPEP has been followed (gm/dL): 0.4 on 07/04/2019, 0.1 on 11/11/2019, and 0.4 on 05/21/2020.  He has a history of a diverticularbleed3 years ago necessitating admission. He had an EGD and colonoscopyin [place] about 2 years ago (no report available). Per patient,there were polyps andno evidence ofulcer orgastritis. He has not undergone a capsule study.Guaiac cardswere negative on 01/06/2018. Urinalysiswas negative for blood on 12/12/2017.   He has iron deficiency.  Ferritin was 12 on 07/04/2019, 20 on 08/21/2019, and 26 on 11/21/2019.  He is on oral B12.  The patient received his COVID-19 vaccines on 01/18/2020 and in 01/2020  Symptomatically, ***  Plan: 1.   Labs today: CBC with diff, CMP, SPEP, ferritin, iron studies   2.   Monoclonal gammopathy of unknown significance (MGUS) He has an IgA monoclonal gammopathy. M-spike is 0.4 gm/dL (slight  increase).             UPEP revealed no monoclonal protein.             He has no CRAB criteria: Hypercalcemia, renal dysfunction, anemia and bone lesions. Continue to monitor every 6 months. 3. Iron deficiencyanemia Hemoglobin 12.2. MCV 89.7 on 07/04/2019.   Ferritin 12.   Hemoglobin 13.8.  MCV 92.1 on 11/21/2019.   Ferritin 26.  Hemoglobin 13.3.  MCV 93.1 on 05/21/2020.   Ferritin 34.  He has a history of lower GI bleed and subsequent iron deficiency. He denies any bleeding but notes dark stools on oral iron. Continue oral iron with vitamin C.         Ferritin goal is 100.    His last colonoscopy was years ago and he has not followed up with GI. 4.   GI consult- rectal incontinence and iron deficiency. 5.   RTC in 3 months for labs (CBC, ferritin). 6.   RTC in 6 months for MD assessment and labs (CBC with diff, CMP, SPEP, ferritin, iron studies).  I discussed the assessment and treatment plan with the patient.  The patient was provided an opportunity to ask questions and all were answered.  The patient agreed with the plan and demonstrated an understanding of the instructions.  The patient was advised to call back if the symptoms worsen or if the condition fails to improve as anticipated.  I provided *** minutes of face-to-face time during this this encounter and > 50% was spent counseling as documented under my assessment and plan.  Lequita Asal, MD, PhD    11/20/2020, 9:48 AM  I, Mirian Mo Tufford, am acting as a Education administrator for Lequita Asal, MD.  I, [place] Mike Gip, MD, have reviewed the above documentation for accuracy and completeness, and I agree with the above.

## 2020-11-21 NOTE — Progress Notes (Deleted)
[facility]  [address] [place], [place] [address] Phone: [phone]  Fax: [phone]   Clinic Day:  11/21/2020   Referring physician: Weber Cooks, MD  Chief Complaint: Ryan Serrano is a 86 y.o. male with a monoclonal gammopathy and iron deficiency anemia who is seen for 6 month assessment.  HPI: The patient was last seen in the hematology clinic on 05/21/2020. At that time, he noted dark stool on oral iron.  Exam was unremarkable. Hematocrit was 40.2, hemoglobin 13.3, platelets 119,000, WBC 6,400. CMP was normal except for a glucose of 131. Ferritin was 34 with an iron saturation of 17% and a TIBC of 314. M spike was 0.4 gm/dL.  The patient saw Dr. Allen Norris on 07/23/2020. He reports stool incontinence and frequent diarrhea. Colonoscopy on 08/04/2020 showed diverticulosis in the entire examined colon. There were non-bleeding internal hemorrhoids. No specimens were collected. EGD on 08/04/2020 revealed a small hiatal hernia and a single 4 mm sessile gastric polyp.  Pathology revealed a hyperplastic polyp with intestinal metaplasia and no evidence of malignancy.  The patient was admitted to [facility] from 11/07/2020-11/08/2020. He reported waves of abdominal pain and one episode of syncope. He received fluids and ondansetron. Abdomen and pelvis CT revealed no acute abnormality.  Head CT showed no acute intracranial abnormality. Echo revealed an EF of 65-70%. Carotid doppler did not show any significant stenosis. It was felt that his syncope could have been vasovagal.  Labs on 08/21/2020 revealed a hematocrit of 40.2, hemoglobin 13.4, platelets 150,000, WBC 7,600. Ferritin was 54.  During the interim, ***   Past Medical History:  Diagnosis Date  . Diabetes mellitus without complication (Ryan Serrano)   . Diverticulitis   . GERD (gastroesophageal reflux disease)   . Heart murmur   . High cholesterol   . Hypertension   . Kidney stones   . Stroke [facility]) 2008  .  Wears dentures    Full upper and lower    Past Surgical History:  Procedure Laterality Date  . BIOPSY N/A 08/04/2020   Procedure: BIOPSY;  Surgeon: Lucilla Lame, MD;  Location: [place];  Service: Endoscopy;  Laterality: N/A;  . COLONOSCOPY WITH PROPOFOL N/A 08/04/2020   Procedure: COLONOSCOPY WITH PROPOFOL;  Surgeon: Lucilla Lame, MD;  Location: [place];  Service: Endoscopy;  Laterality: N/A;  . ESOPHAGOGASTRODUODENOSCOPY (EGD) WITH PROPOFOL N/A 08/04/2020   Procedure: ESOPHAGOGASTRODUODENOSCOPY (EGD) WITH PROPOFOL;  Surgeon: Lucilla Lame, MD;  Location: [place];  Service: Endoscopy;  Laterality: N/A;  Diabetic - oral meds  . ESOPHAGUS SURGERY    . FINGER AMPUTATION Left   . HEMORRHOID BANDING    . HERNIA REPAIR    . KIDNEY STONE SURGERY      Family History  Problem Relation Age of Onset  . Hypertension Mother   . Cancer Father   . Diabetes Father   . Hypertension Father     Social History:  reports that he quit smoking about 42 years ago. His smoking use included cigarettes. He quit after 16.00 years of use. His smokeless tobacco use includes chew. He reports current alcohol use. He reports that he does not use drugs. He denies any known exposure to radiation or toxins. Hecurrently works farming soybeans, corn, and wheat. He lives in [place].He was been married for 55 years and has 4 children.His wife died 2 years ago.  His daughter's name is Ryan Serrano [phone]).He can be reached at [phone])  His nephew passed away.  The patient is  alone*** today.  Allergies:  Allergies  Allergen Reactions  . Hydrochlorothiazide     Other reaction(s): hyponatremia-stopped by cards  . Penicillins Anaphylaxis and Other (See Comments)  . Atorvastatin Other (See Comments)    Other reaction(s): myalgias 12-06   . Bee Venom Other (See Comments)  . Hydrocodone Nausea And Vomiting  . Hydrocodone-Acetaminophen Nausea And Vomiting    Current  Medications: Current Outpatient Medications  Medication Sig Dispense Refill  . acetaminophen (TYLENOL) 650 MG CR tablet Take 1,300 mg by mouth 2 (two) times daily.    Marland Kitchen aspirin EC 81 MG tablet Take 81 mg by mouth daily.     . Blood Glucose Monitoring Suppl (TRUE METRIX METER) w/Device KIT     . diclofenac sodium (VOLTAREN) 1 % GEL Apply 2 g topically as needed.     . ferrous gluconate (FERGON) 240 (27 FE) MG tablet Take 240 mg by mouth every morning.    . fluticasone (FLONASE) 50 MCG/ACT nasal spray 1 spray by Each Nare route daily.    Marland Kitchen gabapentin (NEURONTIN) 600 MG tablet Take 600 mg by mouth 3 (three) times daily.    . metFORMIN (GLUCOPHAGE-XR) 500 MG 24 hr tablet Take 500 mg by mouth 2 (two) times daily.     . Multiple Vitamins-Minerals (MULTIVITAMIN ADULTS 50+ PO) Take 1 tablet by mouth daily.    . NON FORMULARY Take 2 capsules by mouth 2 (two) times daily. Omega XL takes bid    . pantoprazole (PROTONIX) 40 MG tablet Take 40 mg by mouth daily.    . simvastatin (ZOCOR) 20 MG tablet Take 20 mg by mouth daily at 6 PM.     . tamsulosin (FLOMAX) 0.4 MG CAPS capsule Take 0.4 mg by mouth daily.     . TRUE METRIX BLOOD GLUCOSE TEST test strip     . vitamin B-12 (CYANOCOBALAMIN) 1000 MCG tablet Take 1,000 mcg by mouth daily.    . [employer] Thin Lancets MISC Apply 1 each topically daily.     No current facility-administered medications for this visit.    Review of Systems  Constitutional: Positive for weight loss (3 lbs). Negative for chills, diaphoresis, fever and malaise/fatigue.       Feels "good".  HENT: Negative.  Negative for congestion, ear pain, hearing loss, nosebleeds, sinus pain and sore throat.   Eyes: Negative.  Negative for blurred vision, double vision and pain.  Respiratory: Negative.  Negative for cough, sputum production, shortness of breath and wheezing.   Cardiovascular: Negative.  Negative for chest pain, palpitations, orthopnea, leg swelling and PND.  Gastrointestinal:  Positive for diarrhea (often) and melena (on oral iron). Negative for abdominal pain, blood in stool, constipation, heartburn, nausea and vomiting.       Eating well. Reports that his bowels "have been leaking."  Genitourinary: Negative.  Negative for dysuria, frequency, hematuria and urgency.  Musculoskeletal: Positive for back pain (right sided) and joint pain (right hip, from fall years ago). Negative for myalgias.  Skin: Negative.  Negative for itching and rash.  Neurological: Positive for sensory change (diabetic neuropathy, stable). Negative for tingling, focal weakness, weakness and headaches.  Endo/Heme/Allergies: Negative.  Does not bruise/bleed easily.       Diabetes.  Psychiatric/Behavioral: Negative.  Negative for depression, memory loss and substance abuse. The patient is not nervous/anxious and does not have insomnia.   All other systems reviewed and are negative.  Performance status (ECOG): 0-1***  Vitals There were no vitals taken for this visit.  Physical Exam Nursing note reviewed.  Constitutional:      General: He is not in acute distress.    Appearance: Normal appearance. He is not ill-appearing or diaphoretic.  HENT:     Head: Normocephalic and atraumatic.     Comments: Cap and mask.    Mouth/Throat:     Mouth: Mucous membranes are moist.     Pharynx: Oropharynx is clear. No oropharyngeal exudate or posterior oropharyngeal erythema.  Eyes:     General: No scleral icterus.    Extraocular Movements: Extraocular movements intact.     Conjunctiva/sclera: Conjunctivae normal.     Pupils: Pupils are equal, round, and reactive to light.     Comments: Bilateral arcus.  Cardiovascular:     Rate and Rhythm: Normal rate and regular rhythm.     Heart sounds: Normal heart sounds.  Pulmonary:     Breath sounds: Normal breath sounds.  Abdominal:     General: There is no distension.     Palpations: Abdomen is soft. There is no mass.     Tenderness: There is no abdominal  tenderness. There is no guarding or rebound.  Musculoskeletal:        General: No swelling, tenderness, deformity or signs of injury.     Cervical back: Normal range of motion and neck supple.  Lymphadenopathy:     Cervical: No cervical adenopathy.  Skin:    General: Skin is warm.     Coloration: Skin is not jaundiced or pale.     Findings: No erythema.  Neurological:     Mental Status: He is alert and oriented to person, place, and time.  Psychiatric:        Behavior: Behavior normal.        Thought Content: Thought content normal.        Judgment: Judgment normal.     No visits with results within 3 Day(s) from this visit.  Latest known visit with results is:  Admission on 11/07/2020, Discharged on 11/08/2020  Component Date Value Ref Range Status  . Lipase 11/07/2020 35  11 - 51 U/L Final   Performed at [facility], [address] [place], [place] [address]  . Sodium 11/07/2020 141  135 - 145 mmol/L Final  . Potassium 11/07/2020 4.2  3.5 - 5.1 mmol/L Final  . Chloride 11/07/2020 107  98 - 111 mmol/L Final  . CO2 11/07/2020 22  22 - 32 mmol/L Final  . Glucose, Bld 11/07/2020 129* 70 - 99 mg/dL Final   Glucose reference range applies only to samples taken after fasting for at least 8 hours.  . BUN 11/07/2020 15  8 - 23 mg/dL Final  . Creatinine, Ser 11/07/2020 1.49* 0.61 - 1.24 mg/dL Final  . Calcium 11/07/2020 8.5* 8.9 - 10.3 mg/dL Final  . Total Protein 11/07/2020 6.9  6.5 - 8.1 g/dL Final  . Albumin 11/07/2020 3.7  3.5 - 5.0 g/dL Final  . AST 11/07/2020 18  15 - 41 U/L Final  . ALT 11/07/2020 10  0 - 44 U/L Final  . Alkaline Phosphatase 11/07/2020 48  38 - 126 U/L Final  . Total Bilirubin 11/07/2020 0.7  0.3 - 1.2 mg/dL Final  . GFR, Estimated 11/07/2020 46* >60 mL/min Final   Comment: (NOTE) Calculated using the CKD-EPI Creatinine Equation (2021)   . Anion gap 11/07/2020 12  5 - 15 Final   Performed at [facility], [address]  [place], [place] [address]  . WBC 11/07/2020  10.5  4.0 - 10.5 K/uL Final  . RBC 11/07/2020 4.06* 4.22 - 5.81 MIL/uL Final  . Hemoglobin 11/07/2020 12.3* 13.0 - 17.0 g/dL Final  . HCT 11/07/2020 38.6* 39.0 - 52.0 % Final  . MCV 11/07/2020 95.1  80.0 - 100.0 fL Final  . MCH 11/07/2020 30.3  26.0 - 34.0 pg Final  . MCHC 11/07/2020 31.9  30.0 - 36.0 g/dL Final  . RDW 11/07/2020 13.2  11.5 - 15.5 % Final  . Platelets 11/07/2020 126* 150 - 400 K/uL Final  . nRBC 11/07/2020 0.0  0.0 - 0.2 % Final   Performed at [facility], [address] [place], [place] [address]  . Color, Urine 11/07/2020 COLORLESS* YELLOW Final  . APPearance 11/07/2020 CLEAR* CLEAR Final  . Specific Gravity, Urine 11/07/2020 1.013  1.005 - 1.030 Final  . pH 11/07/2020 6.0  5.0 - 8.0 Final  . Glucose, UA 11/07/2020 NEGATIVE  NEGATIVE mg/dL Final  . Hgb urine dipstick 11/07/2020 NEGATIVE  NEGATIVE Final  . Bilirubin Urine 11/07/2020 NEGATIVE  NEGATIVE Final  . Ketones, ur 11/07/2020 NEGATIVE  NEGATIVE mg/dL Final  . Protein, ur 11/07/2020 NEGATIVE  NEGATIVE mg/dL Final  . Nitrite 11/07/2020 NEGATIVE  NEGATIVE Final  . Chalmers Guest 11/07/2020 NEGATIVE  NEGATIVE Final  . WBC, UA 11/07/2020 NONE SEEN  0 - 5 WBC/hpf Final  . Bacteria, UA 11/07/2020 NONE SEEN  NONE SEEN Final  . Squamous Epithelial / LPF 11/07/2020 NONE SEEN  0 - 5 Final   Performed at [facility], [address], [place], [place] [address]  . Troponin I (High Sensitivity) 11/07/2020 7  <18 ng/L Final   Comment: (NOTE) Elevated high sensitivity troponin I (hsTnI) values and significant  changes across serial measurements may suggest ACS but many other  chronic and acute conditions are known to elevate hsTnI results.  Refer to the "Links" section for chest pain algorithms and additional  guidance. Performed at [facility], [address], [place], [place] [address]   . Troponin I (High Sensitivity) 11/07/2020 6  <18 ng/L Final    Comment: (NOTE) Elevated high sensitivity troponin I (hsTnI) values and significant  changes across serial measurements may suggest ACS but many other  chronic and acute conditions are known to elevate hsTnI results.  Refer to the "Links" section for chest pain algorithms and additional  guidance. Performed at [facility], [address] [place], [place] [address]   . SARS Coronavirus 2 by RT PCR 11/07/2020 NEGATIVE  NEGATIVE Final   Comment: (NOTE) SARS-CoV-2 target nucleic acids are NOT DETECTED.  The SARS-CoV-2 RNA is generally detectable in upper respiratory specimens during the acute phase of infection. The lowest concentration of SARS-CoV-2 viral copies this assay can detect is 138 copies/mL. A negative result does not preclude SARS-Cov-2 infection and should not be used as the sole basis for treatment or other patient management decisions. A negative result may occur with  improper specimen collection/handling, submission of specimen other than nasopharyngeal swab, presence of viral mutation(s) within the areas targeted by this assay, and inadequate number of viral copies(<138 copies/mL). A negative result must be combined with clinical observations, patient history, and epidemiological information. The expected result is Negative.  Fact Sheet for Patients:  EntrepreneurPulse.com.au  Fact Sheet for Healthcare Providers:  IncredibleEmployment.be  This test is no                          t yet approved  or cleared by the [place] and  has been authorized for detection and/or diagnosis of SARS-CoV-2 by FDA under an Emergency Use Authorization (EUA). This EUA will remain  in effect (meaning this test can be used) for the duration of the COVID-19 declaration under Section 564(b)(1) of the Act, 21 U.S.C.section 360bbb-3(b)(1), unless the authorization is terminated  or revoked sooner.      . Influenza A by PCR  11/07/2020 NEGATIVE  NEGATIVE Final  . Influenza B by PCR 11/07/2020 NEGATIVE  NEGATIVE Final   Comment: (NOTE) The Xpert Xpress SARS-CoV-2/FLU/RSV plus assay is intended as an aid in the diagnosis of influenza from Nasopharyngeal swab specimens and should not be used as a sole basis for treatment. Nasal washings and aspirates are unacceptable for Xpert Xpress SARS-CoV-2/FLU/RSV testing.  Fact Sheet for Patients: EntrepreneurPulse.com.au  Fact Sheet for Healthcare Providers: IncredibleEmployment.be  This test is not yet approved or cleared by the [place] FDA and has been authorized for detection and/or diagnosis of SARS-CoV-2 by FDA under an Emergency Use Authorization (EUA). This EUA will remain in effect (meaning this test can be used) for the duration of the COVID-19 declaration under Section 564(b)(1) of the Act, 21 U.S.C. section 360bbb-3(b)(1), unless the authorization is terminated or revoked.  Performed at [facility], [address] [place], [place] [address]   . Weight 11/08/2020 2,464  oz Final  . Height 11/08/2020 68  in Final  . BP 11/08/2020 129/90  mmHg Final  . Ao pk vel 11/08/2020 1.29  m/s Final  . AV Area VTI 11/08/2020 2.00  cm2 Final  . AR max vel 11/08/2020 2.30  cm2 Final  . AV Mean grad 11/08/2020 4.0  mmHg Final  . AV Peak grad 11/08/2020 6.7  mmHg Final  . S' Lateral 11/08/2020 3.03  cm Final  . AV Area mean vel 11/08/2020 2.14  cm2 Final  . Area-P 1/2 11/08/2020 1.62  cm2 Final  . TSH 11/07/2020 0.947  0.350 - 4.500 uIU/mL Final   Comment: Performed by a 3rd Generation assay with a functional sensitivity of <=0.01 uIU/mL. Performed at [facility], [address], [place], [place] [address]   . B Natriuretic Peptide 11/07/2020 58.4  0.0 - 100.0 pg/mL Final   Performed at [facility], [address] [place], [place] [address]  . Specimen Description 11/08/2020 BLOOD BLOOD  LEFT FOREARM   Final  . Special Requests 11/08/2020 BOTTLES DRAWN AEROBIC AND ANAEROBIC Blood Culture adequate volume   Final  . Culture 11/08/2020    Final                   Value:NO GROWTH 5 DAYS Performed at [facility], [address], [place], [place] [address]   . Report Status 11/08/2020 11/13/2020 FINAL   Final  . Hgb A1c MFr Bld 11/08/2020 6.6* 4.8 - 5.6 % Final   Comment: (NOTE) Pre diabetes:          5.7%-6.4%  Diabetes:              >6.4%  Glycemic control for   <7.0% adults with diabetes   . Mean Plasma Glucose 11/08/2020 142.72  mg/dL Final   Performed at [facility], [place] [address] [place], [place] [address]  . Glucose-Capillary 11/08/2020 87  70 - 99 mg/dL Final   Glucose reference range applies only to samples taken after fasting for at least 8 hours.  . Glucose-Capillary 11/08/2020 89  70 - 99 mg/dL Final   Glucose reference range applies only to samples taken after fasting for at least 8 hours.  . Glucose-Capillary 11/08/2020 78  70 - 99 mg/dL Final   Glucose reference range applies only to samples taken after fasting for at least 8 hours.  . Glucose-Capillary 11/08/2020 149* 70 - 99 mg/dL Final   Glucose reference range applies only to samples taken after fasting for at least 8 hours.  . Sodium 11/08/2020 142  135 - 145 mmol/L Final  . Potassium 11/08/2020 4.3  3.5 - 5.1 mmol/L Final  . Chloride 11/08/2020 111  98 - 111 mmol/L Final  . CO2 11/08/2020 25  22 - 32 mmol/L Final  . Glucose, Bld 11/08/2020 88  70 - 99 mg/dL Final   Glucose reference range applies only to samples taken after fasting for at least 8 hours.  . BUN 11/08/2020 13  8 - 23 mg/dL Final  . Creatinine, Ser 11/08/2020 1.10  0.61 - 1.24 mg/dL Final  . Calcium 11/08/2020 8.6* 8.9 - 10.3 mg/dL Final  . GFR, Estimated 11/08/2020 >60  >60 mL/min Final   Comment: (NOTE) Calculated using the CKD-EPI Creatinine Equation (2021)   . Anion gap 11/08/2020 6  5 - 15 Final   Performed  at [facility], [address] [place], [place] [address]    Assessment:  Ryan Serrano is a 86 y.o. male withiron deficiencyand an IgA monoclonal gammopathy. Hemoglobinhas ranged from 11.2 - 11.5.  [facility] labs from 09/26/2018 - 03/05/2020revealed following normal studies: B12, LDH, Coombs. MMA was 17.8 (0.3 - 2.8).  Work-up on 07/04/2019:hematocrit 39.1, hemoglobin 12.2, MCV 89.7, platelets 137,000, WBC 7,400 with a normal differential.Normal studiesincluded: creatinine (1.04), calcium (9.1), LFTs, haptoglobin, LDH, folate, and TSH. Ferritinwas 12 with an iron saturation 15%and a TIBC of 395.SPEPrevealed a 0.4gm/dL IgA monoclonal protein with kappa light chain specificity.IgG was878, IgA 527(61-437),andIgM 44.  Kappa free light chains 74, lambda light chains 25.3 and ratio 2.92 (0.26 - 1.65) on 07/25/2019.  Beta-2 microglobulin was 2.0 (normal).  24-hour UPEP revealed no monoclonal protein with kappa/lambda free light chain ratio of > 15.72 (1.03 - 31.76).  SPEP has been followed (gm/dL): 0.4 on 07/04/2019, 0.1 on 11/11/2019, and 0.4 on 05/21/2020.  He has a history of a diverticularbleed3 years ago necessitating admission. He had an EGD and colonoscopyin [place] about 2 years ago (no report available). Per patient,there were polyps andno evidence ofulcer orgastritis. He has not undergone a capsule study.Colonoscopy on 08/04/2020 showed diverticulosis in the entire examined colon. There were non-bleeding internal hemorrhoids. No specimens were collected. EGD on 08/04/2020 revealed a small hiatal hernia and a single 4 mm sessile gastric polyp.  Pathology revealed a hyperplastic polyp with intestinal metaplasia and no evidence of malignancy.  Guaiac cardswere negative on 01/06/2018. Urinalysiswas negative for blood on 12/12/2017.   He has iron deficiency.  Ferritin was 12 on 07/04/2019, 20 on 08/21/2019, and 26 on 11/21/2019.  He is on  oral B12.  The patient received his COVID-19 vaccines on 01/18/2020 and in 01/2020  Symptomatically, ***  Plan: 1.   Labs today: CBC with diff, CMP, SPEP, ferritin, iron studies   2.   Monoclonal gammopathy of unknown significance (MGUS) He has an IgA monoclonal gammopathy. M-spike is 0.4 gm/dL (slight increase).             UPEP revealed no monoclonal protein.             He has no CRAB  criteria: Hypercalcemia, renal dysfunction, anemia and bone lesions. Continue to monitor every 6 months. 3. Iron deficiencyanemia Hemoglobin 12.2. MCV 89.7 on 07/04/2019.   Ferritin 12.   Hemoglobin 13.8.  MCV 92.1 on 11/21/2019.   Ferritin 26.  Hemoglobin 13.3.  MCV 93.1 on 05/21/2020.   Ferritin 34. He has a history of lower GI bleed and subsequent iron deficiency. He denies any bleeding but notes dark stools on oral iron. Continue oral iron with vitamin C.         Ferritin goal is 100.    His last colonoscopy was years ago and he has not followed up with GI. 4.   GI consult- rectal incontinence and iron deficiency. 5.   RTC in 3 months for labs (CBC, ferritin). 6.   RTC in 6 months for MD assessment and labs (CBC with diff, CMP, SPEP, ferritin, iron studies).  I discussed the assessment and treatment plan with the patient.  The patient was provided an opportunity to ask questions and all were answered.  The patient agreed with the plan and demonstrated an understanding of the instructions.  The patient was advised to call back if the symptoms worsen or if the condition fails to improve as anticipated.  I provided *** minutes of face-to-face time during this this encounter and > 50% was spent counseling as documented under my assessment and plan.  Lequita Asal, MD, PhD    11/21/2020, 9:26 AM  I, Lequita Asal, am acting as a Education administrator for Lequita Asal, MD.  I, [place] Mike Gip, MD, have reviewed the  above documentation for accuracy and completeness, and I agree with the above.

## 2020-11-24 ENCOUNTER — Inpatient Hospital Stay: Payer: Medicare HMO | Attending: Hematology and Oncology

## 2020-11-24 ENCOUNTER — Inpatient Hospital Stay: Payer: Medicare HMO | Admitting: Hematology and Oncology

## 2020-11-24 DIAGNOSIS — D472 Monoclonal gammopathy: Secondary | ICD-10-CM

## 2020-11-24 DIAGNOSIS — D509 Iron deficiency anemia, unspecified: Secondary | ICD-10-CM

## 2021-03-26 ENCOUNTER — Encounter: Payer: Self-pay | Admitting: Hematology and Oncology

## 2021-03-27 NOTE — Telephone Encounter (Signed)
03/27/2021 Spoke w/ pts daughter and informed her appt has been r/s for 04/21/21 @ 2:15. Appts confirmed by daughter SRW

## 2021-04-17 ENCOUNTER — Other Ambulatory Visit: Payer: Self-pay

## 2021-04-17 DIAGNOSIS — D509 Iron deficiency anemia, unspecified: Secondary | ICD-10-CM

## 2021-04-21 ENCOUNTER — Inpatient Hospital Stay (HOSPITAL_BASED_OUTPATIENT_CLINIC_OR_DEPARTMENT_OTHER): Payer: Medicare HMO | Admitting: Nurse Practitioner

## 2021-04-21 ENCOUNTER — Other Ambulatory Visit: Payer: Self-pay

## 2021-04-21 ENCOUNTER — Encounter: Payer: Self-pay | Admitting: Nurse Practitioner

## 2021-04-21 ENCOUNTER — Inpatient Hospital Stay: Payer: Medicare HMO | Attending: Hematology and Oncology

## 2021-04-21 VITALS — BP 138/81 | HR 75 | Temp 97.6°F | Wt 166.4 lb

## 2021-04-21 DIAGNOSIS — Z7982 Long term (current) use of aspirin: Secondary | ICD-10-CM | POA: Insufficient documentation

## 2021-04-21 DIAGNOSIS — E7800 Pure hypercholesterolemia, unspecified: Secondary | ICD-10-CM | POA: Diagnosis not present

## 2021-04-21 DIAGNOSIS — I10 Essential (primary) hypertension: Secondary | ICD-10-CM | POA: Insufficient documentation

## 2021-04-21 DIAGNOSIS — Z79899 Other long term (current) drug therapy: Secondary | ICD-10-CM | POA: Insufficient documentation

## 2021-04-21 DIAGNOSIS — E119 Type 2 diabetes mellitus without complications: Secondary | ICD-10-CM | POA: Insufficient documentation

## 2021-04-21 DIAGNOSIS — Z8673 Personal history of transient ischemic attack (TIA), and cerebral infarction without residual deficits: Secondary | ICD-10-CM | POA: Insufficient documentation

## 2021-04-21 DIAGNOSIS — D509 Iron deficiency anemia, unspecified: Secondary | ICD-10-CM

## 2021-04-21 DIAGNOSIS — D472 Monoclonal gammopathy: Secondary | ICD-10-CM

## 2021-04-21 DIAGNOSIS — Z87891 Personal history of nicotine dependence: Secondary | ICD-10-CM | POA: Diagnosis not present

## 2021-04-21 DIAGNOSIS — Z7984 Long term (current) use of oral hypoglycemic drugs: Secondary | ICD-10-CM | POA: Diagnosis not present

## 2021-04-21 LAB — IRON AND TIBC
Iron: 85 ug/dL (ref 45–182)
Saturation Ratios: 26 % (ref 17.9–39.5)
TIBC: 328 ug/dL (ref 250–450)
UIBC: 243 ug/dL

## 2021-04-21 LAB — CBC WITH DIFFERENTIAL/PLATELET
Abs Immature Granulocytes: 0.02 10*3/uL (ref 0.00–0.07)
Basophils Absolute: 0.1 10*3/uL (ref 0.0–0.1)
Basophils Relative: 1 %
Eosinophils Absolute: 0.1 10*3/uL (ref 0.0–0.5)
Eosinophils Relative: 2 %
HCT: 39.5 % (ref 39.0–52.0)
Hemoglobin: 12.8 g/dL — ABNORMAL LOW (ref 13.0–17.0)
Immature Granulocytes: 0 %
Lymphocytes Relative: 21 %
Lymphs Abs: 1.4 10*3/uL (ref 0.7–4.0)
MCH: 29.7 pg (ref 26.0–34.0)
MCHC: 32.4 g/dL (ref 30.0–36.0)
MCV: 91.6 fL (ref 80.0–100.0)
Monocytes Absolute: 0.5 10*3/uL (ref 0.1–1.0)
Monocytes Relative: 7 %
Neutro Abs: 4.5 10*3/uL (ref 1.7–7.7)
Neutrophils Relative %: 69 %
Platelets: 115 10*3/uL — ABNORMAL LOW (ref 150–400)
RBC: 4.31 MIL/uL (ref 4.22–5.81)
RDW: 13.8 % (ref 11.5–15.5)
WBC: 6.6 10*3/uL (ref 4.0–10.5)
nRBC: 0 % (ref 0.0–0.2)

## 2021-04-21 LAB — COMPREHENSIVE METABOLIC PANEL
ALT: 10 U/L (ref 0–44)
AST: 16 U/L (ref 15–41)
Albumin: 3.7 g/dL (ref 3.5–5.0)
Alkaline Phosphatase: 47 U/L (ref 38–126)
Anion gap: 7 (ref 5–15)
BUN: 15 mg/dL (ref 8–23)
CO2: 25 mmol/L (ref 22–32)
Calcium: 8.9 mg/dL (ref 8.9–10.3)
Chloride: 105 mmol/L (ref 98–111)
Creatinine, Ser: 1.09 mg/dL (ref 0.61–1.24)
GFR, Estimated: >60 mL/min (ref >60)
Glucose, Bld: 221 mg/dL — ABNORMAL HIGH (ref 70–99)
Potassium: 4 mmol/L (ref 3.5–5.1)
Sodium: 137 mmol/L (ref 135–145)
Total Bilirubin: 0.5 mg/dL (ref 0.3–1.2)
Total Protein: 6.8 g/dL (ref 6.5–8.1)

## 2021-04-21 LAB — FERRITIN: Ferritin: 35 ng/mL (ref 24–336)

## 2021-04-21 NOTE — Patient Instructions (Signed)
Monoclonal Gammopathy of Undetermined Significance Monoclonal gammopathy of undetermined significance (MGUS) is a condition in which there is too much of a protein called monoclonal protein, or M protein, in the blood. MGUS can cause you to have too many cells in your blood and not enough space for healthy cells. This condition does not cause symptoms, but it may increase your risk of developing multiple myeloma or other blood disorders in the future. What are the causes? The cause of this condition is not known. Genetics and the environment may play a role. What increases the risk? You are more likely to develop this condition if:  You are African American.  You are age 85 or older.  You are male.  You have an autoimmune disease.  You have been exposed to radiation.  You have a family history of MGUS. What are the signs or symptoms? There are no symptoms of this condition. How is this diagnosed? This condition may be diagnosed with a blood test that checks for M protein.   How is this treated? Treatment may involve monitoring your condition. This may include:  Having regular exams. This will allow your health care provider to monitor your health.  Having tests done regularly, such as: ? Blood tests to check for M protein in your body. ? Imaging tests, such as a CT scan. ? A bone marrow biopsy. This test involves taking a sample of bone marrow from your body so it can be looked at under a microscope. Follow these instructions at home:  Keep all follow-up visits as told by your health care provider. This is important. Contact a health care provider if:  You have trouble swallowing.  You have pain in your back or ribs.  You have a fever.  You are bruising easily. Get help right away if:  You break a bone.  You have trouble breathing. Summary  Monoclonal gammopathy of undetermined significance (MGUS) is a condition in which there is too much of a protein called  monoclonal protein, or M protein, in the blood.  This condition may be diagnosed with a blood test that checks for M protein.  Treatment for this condition may involve having tests done regularly. Tests may include blood tests, imaging tests, and a bone marrow biopsy. This information is not intended to replace advice given to you by your health care provider. Make sure you discuss any questions you have with your health care provider. Document Revised: 09/27/2019 Document Reviewed: 09/27/2019 Elsevier Patient Education  2021 [employer].

## 2021-04-21 NOTE — Progress Notes (Signed)
[facility]  [address] [place], [place] [address] Phone: [phone]  Fax: [phone]   Clinic Day:  04/21/2021   Referring physician: Weber Cooks, MD  Chief Complaint: Ryan Serrano is a 87 y.o. male with a monoclonal gammopathy and iron deficiency anemia who is seen for 6 month assessment.  Hematology history:  Ryan Serrano is a 87 y.o. male withiron deficiencyand an IgA monoclonal gammopathy. Hemoglobinhas ranged from 11.2 - 11.5.  [facility] labs from 09/26/2018 - 03/05/2020revealed following normal studies: B12, LDH, [place]. MMA was 17.8 (0.3 - 2.8).  Work-up on 07/04/2019:hematocrit 39.1, hemoglobin 12.2, MCV 89.7, platelets 137,000, WBC 7,400 with a normal differential.Normal studiesincluded: creatinine (1.04), calcium (9.1), LFTs, haptoglobin, LDH, folate, and TSH. Ferritinwas 12 with an iron saturation 15%and a TIBC of 395.SPEPrevealed a 0.4gm/dL IgA monoclonal protein with kappa light chain specificity.IgG was878, IgA 527(61-437),andIgM 44.  Kappa free light chains 74, lambda light chains 25.3 and ratio 2.92 (0.26 - 1.65) on 07/25/2019.  Beta-2 microglobulin was 2.0 (normal).  24-hour UPEP revealed no monoclonal protein with kappa/lambda free light chain ratio of > 15.72 (1.03 - 31.76).  SPEP has been followed (gm/dL): 0.4 on 07/04/2019, 0.1 on 11/11/2019, and 0.4 on 05/21/2020.  He has a history of a diverticularbleed3 years ago necessitating admission. He had an EGD and colonoscopyin [place] about 2 years ago (no report available). Per patient,there were polyps andno evidence ofulcer orgastritis. He has not undergone a capsule study.Guaiac cardswere negative on 01/06/2018. Urinalysiswas negative for blood on 12/12/2017.   He has iron deficiency.  Ferritin was 12 on 07/04/2019, 20 on 08/21/2019, and 26 on 11/21/2019.  He is on oral B12.  The patient received his COVID-19 vaccines on  01/18/2020 and in 01/2020  HPI: Patient returns for reevaluation and continued follow-up for history of MGUS and iron deficiency anemia.  He continues to feel at baseline and denies specific complaints.  Recently had colonoscopy.  Stays active and has new pigs and new puppies.  No dizziness or weakness.  No headaches, numbness or tingling, or changes in vision or hearing.  No abnormal bleeding or bruising.  No Opdivo lumps or bumps.  No abdominal pain, changes in bowel movements, nausea or vomiting.  No fever or night sweats.  No unintentional weight loss.  No fatigue or weakness.  No new bone pain.  No other specific complaints today.       Past Medical History:  Diagnosis Date  . Diabetes mellitus without complication ([place])   . Diverticulitis   . GERD (gastroesophageal reflux disease)   . Heart murmur   . High cholesterol   . Hypertension   . Kidney stones   . Stroke [facility]) 2008  . Wears dentures    Full upper and lower    Past Surgical History:  Procedure Laterality Date  . BIOPSY N/A 08/04/2020   Procedure: BIOPSY;  Surgeon: Lucilla Lame, MD;  Location: [place];  Service: Endoscopy;  Laterality: N/A;  . COLONOSCOPY WITH PROPOFOL N/A 08/04/2020   Procedure: COLONOSCOPY WITH PROPOFOL;  Surgeon: Lucilla Lame, MD;  Location: [place];  Service: Endoscopy;  Laterality: N/A;  . ESOPHAGOGASTRODUODENOSCOPY (EGD) WITH PROPOFOL N/A 08/04/2020   Procedure: ESOPHAGOGASTRODUODENOSCOPY (EGD) WITH PROPOFOL;  Surgeon: Lucilla Lame, MD;  Location: [place];  Service: Endoscopy;  Laterality: N/A;  Diabetic - oral meds  . ESOPHAGUS SURGERY    . FINGER AMPUTATION Left   . HEMORRHOID BANDING    . HERNIA REPAIR    .  KIDNEY STONE SURGERY      Family History  Problem Relation Age of Onset  . Hypertension Mother   . Cancer Father   . Diabetes Father   . Hypertension Father     Social History:  reports that he quit smoking about 42 years ago. His smoking use  included cigarettes. He quit after 16.00 years of use. His smokeless tobacco use includes chew. He reports current alcohol use. He reports that he does not use drugs. He denies any known exposure to radiation or toxins. Hecurrently works farming soybeans, corn, and wheat. He lives in [place].He was been married for 55 years and has 4 children.His wife died 2 years ago.  His daughter's name is [name] [phone]).He can be reached at [phone])  His nephew passed away.  The patient is alone today.  Allergies:  Allergies  Allergen Reactions  . Hydrochlorothiazide     Other reaction(s): hyponatremia-stopped by cards  . Penicillins Anaphylaxis and Other (See Comments)  . Atorvastatin Other (See Comments)    Other reaction(s): myalgias 12-06   . Bee Venom Other (See Comments)  . Hydrocodone Nausea And Vomiting  . Hydrocodone-Acetaminophen Nausea And Vomiting    Current Medications: Current Outpatient Medications  Medication Sig Dispense Refill  . acetaminophen (TYLENOL) 650 MG CR tablet Take 1,300 mg by mouth 2 (two) times daily.    Marland Kitchen aspirin EC 81 MG tablet Take 81 mg by mouth daily.     . Blood Glucose Monitoring Suppl (TRUE METRIX METER) w/Device KIT     . diclofenac sodium (VOLTAREN) 1 % GEL Apply 2 g topically as needed.     . ferrous gluconate (FERGON) 240 (27 FE) MG tablet Take 240 mg by mouth every morning.    . fluticasone (FLONASE) 50 MCG/ACT nasal spray 1 spray by Each Nare route daily.    Marland Kitchen gabapentin (NEURONTIN) 600 MG tablet Take 600 mg by mouth 3 (three) times daily.    . metFORMIN (GLUCOPHAGE-XR) 500 MG 24 hr tablet Take 500 mg by mouth 2 (two) times daily.     . Multiple Vitamins-Minerals (MULTIVITAMIN ADULTS 50+ PO) Take 1 tablet by mouth daily.    . nicotine (NICODERM CQ - DOSED IN MG/24 HR) 7 mg/24hr patch 7 mg daily.    . NON FORMULARY Take 2 capsules by mouth 2 (two) times daily. Omega XL takes bid    . pantoprazole (PROTONIX) 40 MG tablet Take 40 mg by mouth  daily.    . simvastatin (ZOCOR) 20 MG tablet Take 20 mg by mouth daily at 6 PM.     . tamsulosin (FLOMAX) 0.4 MG CAPS capsule Take 0.4 mg by mouth daily.     . TRUE METRIX BLOOD GLUCOSE TEST test strip     . vitamin B-12 (CYANOCOBALAMIN) 1000 MCG tablet Take 1,000 mcg by mouth daily.    . [facility] Thin Lancets MISC Apply 1 each topically daily.     No current facility-administered medications for this visit.    Review of Systems  Constitutional: Negative for chills, fever, malaise/fatigue and weight loss.  HENT: Negative for hearing loss, nosebleeds, sore throat and tinnitus.   Eyes: Negative for blurred vision and double vision.  Respiratory: Negative for cough, hemoptysis, shortness of breath and wheezing.   Cardiovascular: Negative for chest pain, palpitations and leg swelling.  Gastrointestinal: Negative for abdominal pain, blood in stool, constipation, diarrhea, melena, nausea and vomiting.  Genitourinary: Negative for dysuria, hematuria and urgency.  Musculoskeletal: Negative for back pain, falls, joint  pain and myalgias.  Skin: Negative for itching and rash.  Neurological: Negative for dizziness, tingling, sensory change, loss of consciousness, weakness and headaches.  Endo/Heme/Allergies: Negative for environmental allergies. Does not bruise/bleed easily.  Psychiatric/Behavioral: Negative for depression. The patient is not nervous/anxious and does not have insomnia.    Performance status (ECOG): 0-1  Vitals Blood pressure 138/81, pulse 75, temperature 97.6 F (36.4 C), temperature source Tympanic, weight 166 lb 7.2 oz (75.5 kg), SpO2 99 %.   Physical Exam Vitals reviewed.  Constitutional:      Appearance: He is not ill-appearing.     Comments: unaccompanied  HENT:     Head: Normocephalic and atraumatic.  Eyes:     General: No scleral icterus.    Conjunctiva/sclera: Conjunctivae normal.  Cardiovascular:     Rate and Rhythm: Normal rate and regular rhythm.  Pulmonary:      Effort: Pulmonary effort is normal.     Breath sounds: Normal breath sounds.  Abdominal:     General: Abdomen is flat. There is no distension.     Tenderness: There is no abdominal tenderness.  Musculoskeletal:        General: No deformity. Normal range of motion.     Cervical back: Neck supple.  Lymphadenopathy:     Cervical: No cervical adenopathy.  Skin:    General: Skin is warm and dry.  Neurological:     General: No focal deficit present.     Mental Status: He is alert and oriented to person, place, and time.  Psychiatric:        Mood and Affect: Mood normal.        Behavior: Behavior normal.     Appointment on 04/21/2021  Component Date Value Ref Range Status  . Sodium 04/21/2021 137  135 - 145 mmol/L Final  . Potassium 04/21/2021 4.0  3.5 - 5.1 mmol/L Final  . Chloride 04/21/2021 105  98 - 111 mmol/L Final  . CO2 04/21/2021 25  22 - 32 mmol/L Final  . Glucose, Bld 04/21/2021 221* 70 - 99 mg/dL Final   Glucose reference range applies only to samples taken after fasting for at least 8 hours.  . BUN 04/21/2021 15  8 - 23 mg/dL Final  . Creatinine, Ser 04/21/2021 1.09  0.61 - 1.24 mg/dL Final  . Calcium 04/21/2021 8.9  8.9 - 10.3 mg/dL Final  . Total Protein 04/21/2021 6.8  6.5 - 8.1 g/dL Final  . Albumin 04/21/2021 3.7  3.5 - 5.0 g/dL Final  . AST 04/21/2021 16  15 - 41 U/L Final  . ALT 04/21/2021 10  0 - 44 U/L Final  . Alkaline Phosphatase 04/21/2021 47  38 - 126 U/L Final  . Total Bilirubin 04/21/2021 0.5  0.3 - 1.2 mg/dL Final  . GFR, Estimated 04/21/2021 >60  >60 mL/min Final   Comment: (NOTE) Calculated using the CKD-EPI Creatinine Equation (2021)   . Anion gap 04/21/2021 7  5 - 15 Final   Performed at [facility], [address], [place], [place] [address]  . WBC 04/21/2021 6.6  4.0 - 10.5 K/uL Final  . RBC 04/21/2021 4.31  4.22 - 5.81 MIL/uL Final  . Hemoglobin 04/21/2021 12.8* 13.0 - 17.0 g/dL Final  . HCT 04/21/2021 39.5  39.0 - 52.0 %  Final  . MCV 04/21/2021 91.6  80.0 - 100.0 fL Final  . MCH 04/21/2021 29.7  26.0 - 34.0 pg Final  . MCHC 04/21/2021 32.4  30.0 - 36.0 g/dL Final  .  RDW 04/21/2021 13.8  11.5 - 15.5 % Final  . Platelets 04/21/2021 115* 150 - 400 K/uL Final   Comment: Immature Platelet Fraction may be clinically indicated, consider ordering this additional test [id number]   . nRBC 04/21/2021 0.0  0.0 - 0.2 % Final  . Neutrophils Relative % 04/21/2021 69  % Final  . Neutro Abs 04/21/2021 4.5  1.7 - 7.7 K/uL Final  . Lymphocytes Relative 04/21/2021 21  % Final  . Lymphs Abs 04/21/2021 1.4  0.7 - 4.0 K/uL Final  . Monocytes Relative 04/21/2021 7  % Final  . Monocytes Absolute 04/21/2021 0.5  0.1 - 1.0 K/uL Final  . Eosinophils Relative 04/21/2021 2  % Final  . Eosinophils Absolute 04/21/2021 0.1  0.0 - 0.5 K/uL Final  . Basophils Relative 04/21/2021 1  % Final  . Basophils Absolute 04/21/2021 0.1  0.0 - 0.1 K/uL Final  . Immature Granulocytes 04/21/2021 0  % Final  . Abs Immature Granulocytes 04/21/2021 0.02  0.00 - 0.07 K/uL Final   Performed at [facility], [address] [place], [place] [address]    Assessment: Patient is 87 year old male who returns to clinic for follow-up for:  1.   Monoclonal gammopathy of unknown significance (MGUS)- IgA monoclonal gammopathy.  Labs pending today.  Previously, M spike was 0.4 g/dL.  UPEP previously revealed no monoclonal protein.  Did not meet crab criteria with no evidence of hypercalcemia, renal dysfunction, anemia, or bone lesions.  Recommend continued surveillance with labs in 6 months. 2. Iron deficiencyanemia- s/p colonoscopy 08/06/20. Iron studies pending today but hemoglobin stable to slightly improved. Continue oral iron with vitamin C.  Ferritin goal 100.   Plan:  RTC in 6 months for labs (cbc, cmp, spep, ferritin, iron studies) & MD/NP   I discussed the assessment and treatment plan with the patient.  The patient was provided an  opportunity to ask questions and all were answered.  The patient agreed with the plan and demonstrated an understanding of the instructions.  The patient was advised to call back if the symptoms worsen or if the condition fails to improve as anticipated.  Beckey Rutter, DNP, AGNP-C [place] at [facility]

## 2021-04-23 LAB — PROTEIN ELECTROPHORESIS, SERUM
A/G Ratio: 1.5 (ref 0.7–1.7)
Albumin ELP: 3.8 g/dL (ref 2.9–4.4)
Alpha-1-Globulin: 0.2 g/dL (ref 0.0–0.4)
Alpha-2-Globulin: 0.6 g/dL (ref 0.4–1.0)
Beta Globulin: 1.2 g/dL (ref 0.7–1.3)
Gamma Globulin: 0.6 g/dL (ref 0.4–1.8)
Globulin, Total: 2.6 g/dL (ref 2.2–3.9)
M-Spike, %: 0.2 g/dL — ABNORMAL HIGH
Total Protein ELP: 6.4 g/dL (ref 6.0–8.5)

## 2021-10-20 ENCOUNTER — Ambulatory Visit: Payer: Medicare HMO | Admitting: Internal Medicine

## 2021-10-20 ENCOUNTER — Other Ambulatory Visit: Payer: Self-pay | Admitting: *Deleted

## 2021-10-20 ENCOUNTER — Other Ambulatory Visit: Payer: Medicare HMO

## 2021-10-20 DIAGNOSIS — D509 Iron deficiency anemia, unspecified: Secondary | ICD-10-CM

## 2021-10-20 DIAGNOSIS — D472 Monoclonal gammopathy: Secondary | ICD-10-CM

## 2021-10-21 ENCOUNTER — Inpatient Hospital Stay: Payer: Medicare HMO | Admitting: Internal Medicine

## 2021-10-21 ENCOUNTER — Inpatient Hospital Stay: Payer: Medicare HMO | Attending: Internal Medicine

## 2022-05-03 IMAGING — CT CT HEAD W/O CM
4 series · 16 of 47 positions shown, 18 images · non-contrast
Comparison: None.

CLINICAL DATA: Nausea vomiting dizziness

EXAM:
CT HEAD WITHOUT CONTRAST
TECHNIQUE: Contiguous axial images were obtained from the base of the skull
through the vertex without intravenous contrast.

[Series 2: head bone · axial · 0.41mm/px · z∈[+563,+591]mm · 3 of 74 slices shown]
[im 8/74  bone]
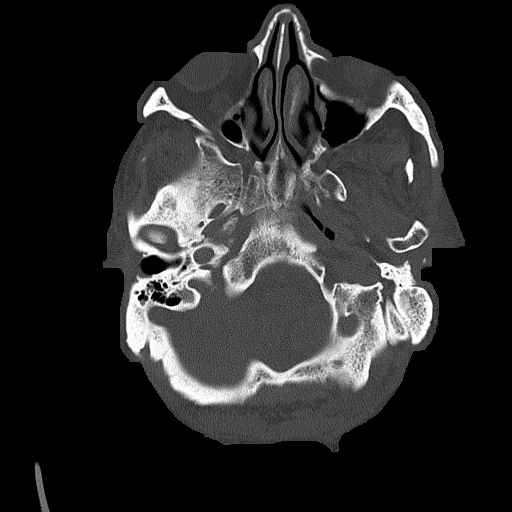
[im 15/74  bone]
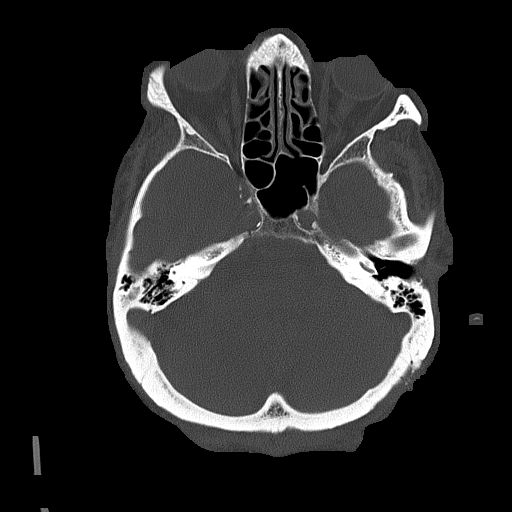
[im 22/74  bone]
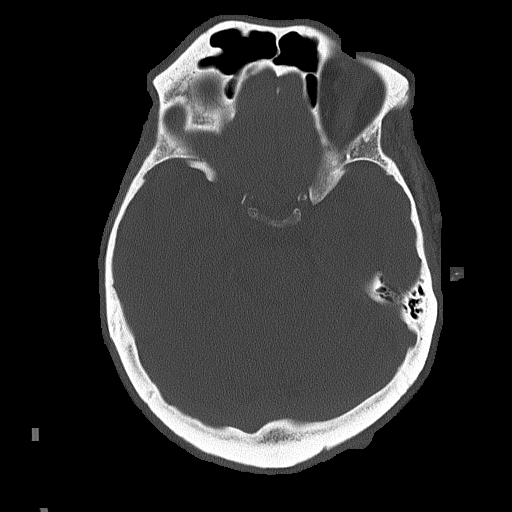

[Series 3: head wo · axial · 0.41mm/px · z∈[+564,+674]mm · 7 of 30 slices shown, 9 images]
[im 4/30  brain]
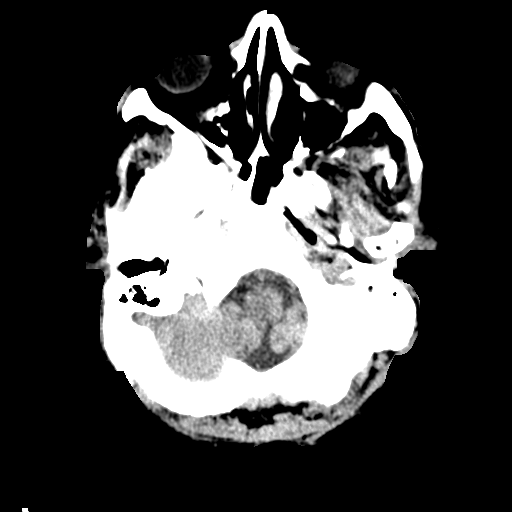
[im 4/30  bone]
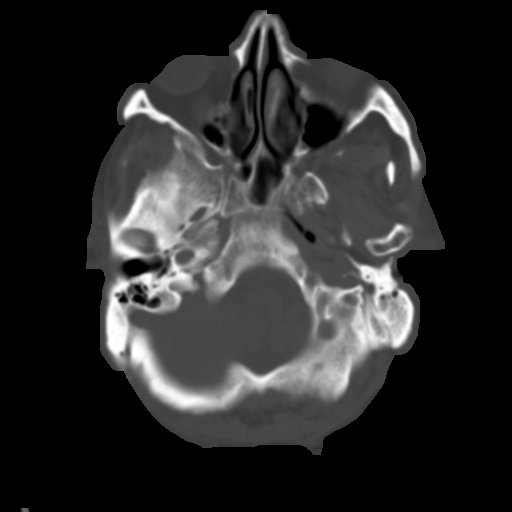
[im 8/30  brain]
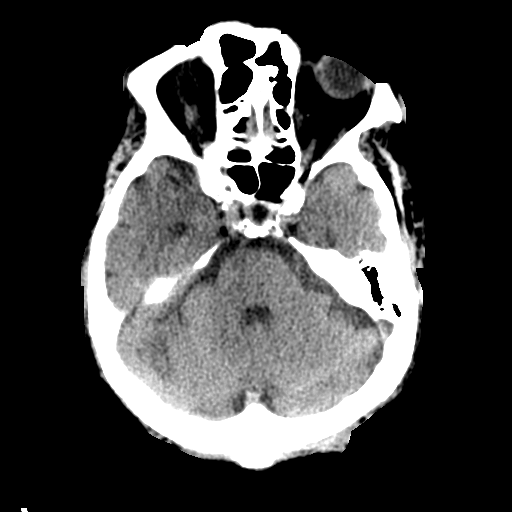
[im 11/30  brain]
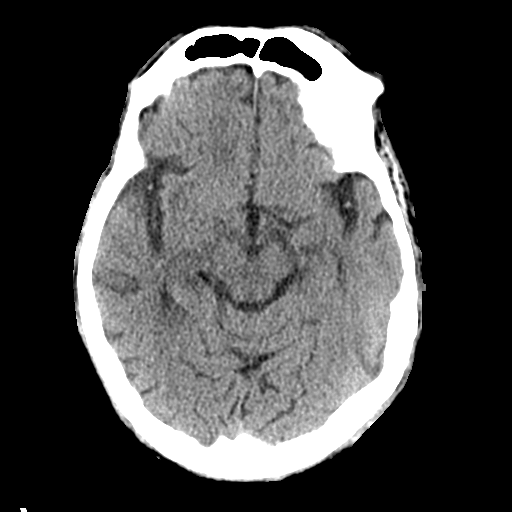
[im 15/30  brain]
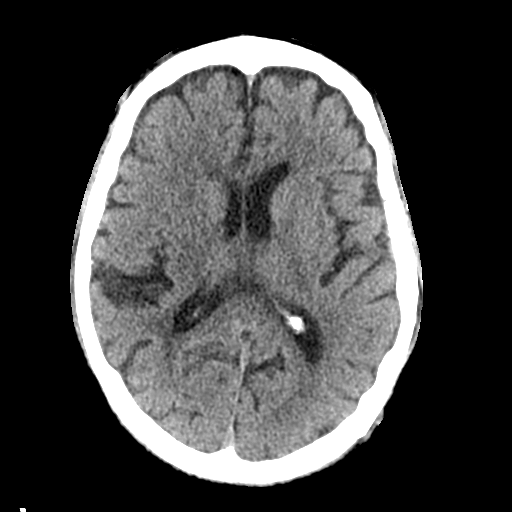
[im 19/30  brain]
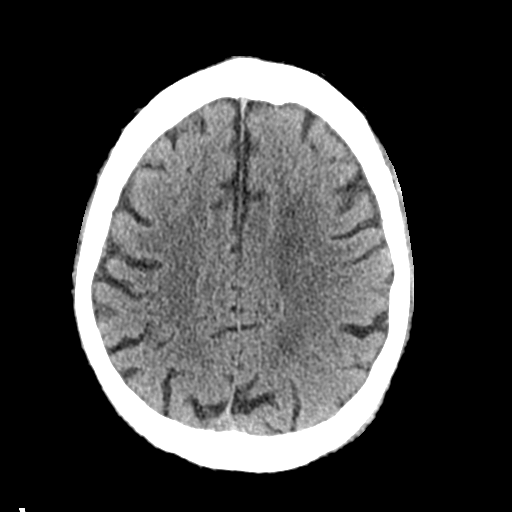
[im 19/30  bone]
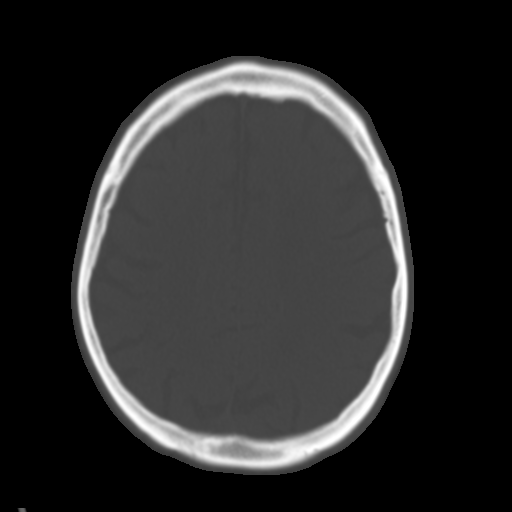
[im 22/30  brain]
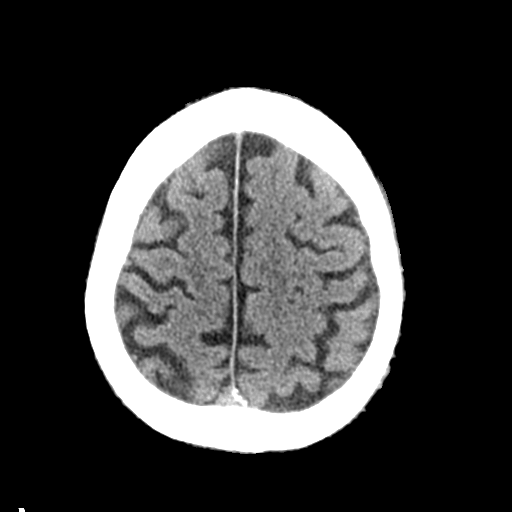
[im 26/30  brain]
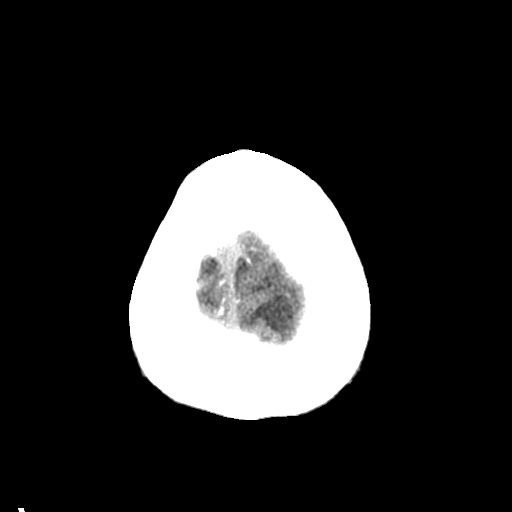

[Series 4: coronal soft tissue · coronal · 0.28mm/px · 3 of 63 slices shown]
[im 21/63  brain]
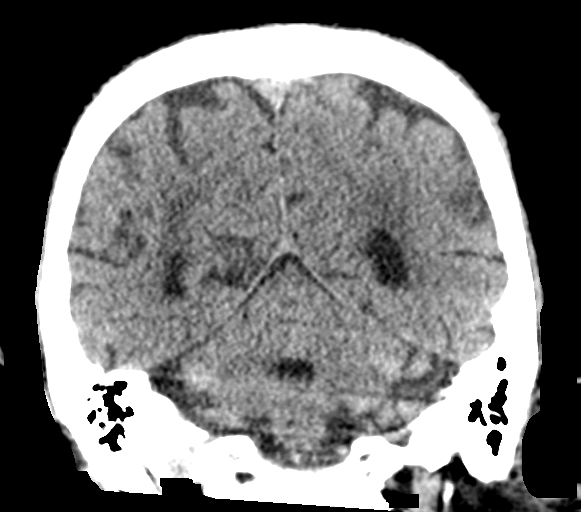
[im 28/63  brain]
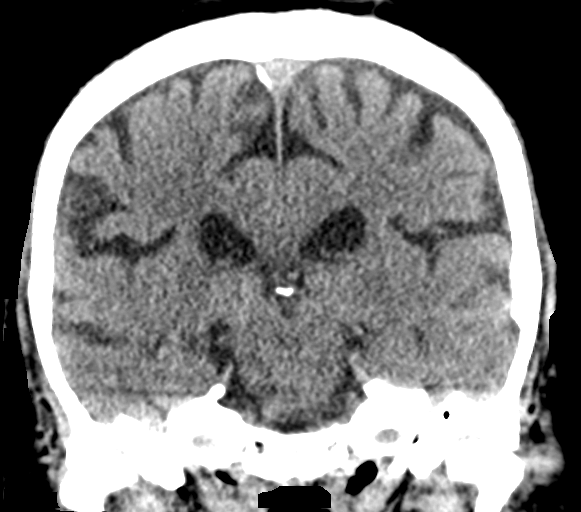
[im 35/63  brain]
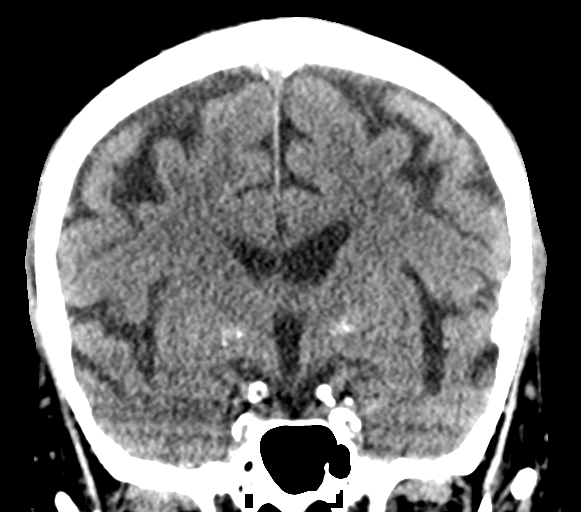

[Series 5: sagittal soft tissue · sagittal · 0.28mm/px · 3 of 54 slices shown]
[im 18/54  brain]
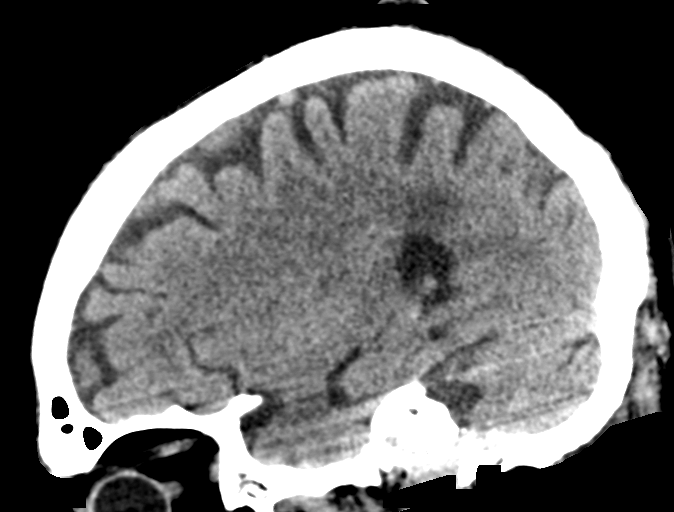
[im 27/54  brain]
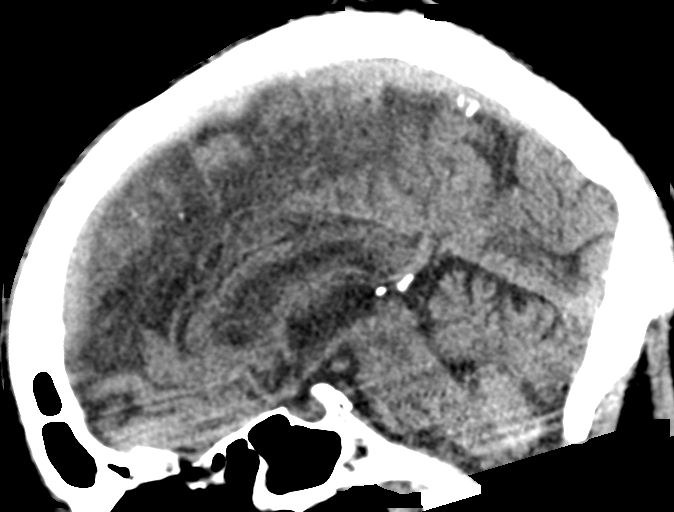
[im 36/54  brain]
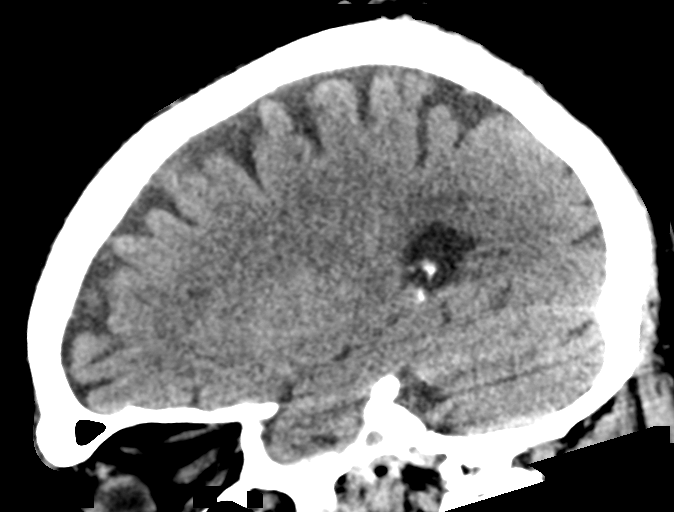

[16 of 47 positions shown; findings below may reference images not displayed]

FINDINGS: Brain: No acute territorial infarction, hemorrhage or intracranial
mass. Mild atrophy. Mild hypodensity in the white matter consistent
with chronic small vessel ischemic change. The ventricles are
nonenlarged

Vascular: No hyperdense vessels.  Carotid vascular calcification

Skull: Normal. Negative for fracture or focal lesion.

Sinuses/Orbits: Mucosal thickening in the sinuses

Other: None
IMPRESSION: 1. No CT evidence for acute intracranial abnormality.
2. Atrophy and mild chronic small vessel ischemic changes of the
white matter.

## 2022-08-23 ENCOUNTER — Inpatient Hospital Stay: Payer: Medicare HMO | Admitting: Internal Medicine

## 2022-08-23 ENCOUNTER — Inpatient Hospital Stay: Payer: Medicare HMO

## 2022-08-24 ENCOUNTER — Inpatient Hospital Stay: Payer: Medicare HMO

## 2022-08-24 ENCOUNTER — Inpatient Hospital Stay: Payer: Medicare HMO | Admitting: Nurse Practitioner

## 2022-09-02 ENCOUNTER — Inpatient Hospital Stay (HOSPITAL_BASED_OUTPATIENT_CLINIC_OR_DEPARTMENT_OTHER): Payer: Medicare HMO | Admitting: Nurse Practitioner

## 2022-09-02 ENCOUNTER — Encounter: Payer: Self-pay | Admitting: Nurse Practitioner

## 2022-09-02 ENCOUNTER — Inpatient Hospital Stay: Payer: Medicare HMO | Attending: Nurse Practitioner

## 2022-09-02 VITALS — BP 119/70 | HR 71 | Temp 97.5°F | Resp 16 | Wt 157.0 lb

## 2022-09-02 DIAGNOSIS — D509 Iron deficiency anemia, unspecified: Secondary | ICD-10-CM | POA: Insufficient documentation

## 2022-09-02 DIAGNOSIS — D472 Monoclonal gammopathy: Secondary | ICD-10-CM | POA: Insufficient documentation

## 2022-09-02 DIAGNOSIS — D696 Thrombocytopenia, unspecified: Secondary | ICD-10-CM | POA: Insufficient documentation

## 2022-09-02 DIAGNOSIS — Z87891 Personal history of nicotine dependence: Secondary | ICD-10-CM | POA: Insufficient documentation

## 2022-09-02 DIAGNOSIS — I10 Essential (primary) hypertension: Secondary | ICD-10-CM | POA: Insufficient documentation

## 2022-09-02 DIAGNOSIS — E119 Type 2 diabetes mellitus without complications: Secondary | ICD-10-CM | POA: Insufficient documentation

## 2022-09-02 LAB — COMPREHENSIVE METABOLIC PANEL
ALT: 16 U/L (ref 0–44)
AST: 21 U/L (ref 15–41)
Albumin: 3.9 g/dL (ref 3.5–5.0)
Alkaline Phosphatase: 55 U/L (ref 38–126)
Anion gap: 10 (ref 5–15)
BUN: 13 mg/dL (ref 8–23)
CO2: 25 mmol/L (ref 22–32)
Calcium: 9.5 mg/dL (ref 8.9–10.3)
Chloride: 107 mmol/L (ref 98–111)
Creatinine, Ser: 1.22 mg/dL (ref 0.61–1.24)
GFR, Estimated: 58 mL/min — ABNORMAL LOW (ref >60)
Glucose, Bld: 163 mg/dL — ABNORMAL HIGH (ref 70–99)
Potassium: 4.1 mmol/L (ref 3.5–5.1)
Sodium: 142 mmol/L (ref 135–145)
Total Bilirubin: 0.8 mg/dL (ref 0.3–1.2)
Total Protein: 7 g/dL (ref 6.5–8.1)

## 2022-09-02 LAB — IRON AND TIBC
Iron: 89 ug/dL (ref 45–182)
Saturation Ratios: 27 % (ref 17.9–39.5)
TIBC: 333 ug/dL (ref 250–450)
UIBC: 244 ug/dL

## 2022-09-02 LAB — CBC WITH DIFFERENTIAL/PLATELET
Abs Immature Granulocytes: 0.02 10*3/uL (ref 0.00–0.07)
Basophils Absolute: 0.1 10*3/uL (ref 0.0–0.1)
Basophils Relative: 1 %
Eosinophils Absolute: 0.1 10*3/uL (ref 0.0–0.5)
Eosinophils Relative: 2 %
HCT: 42.9 % (ref 39.0–52.0)
Hemoglobin: 13.9 g/dL (ref 13.0–17.0)
Immature Granulocytes: 0 %
Lymphocytes Relative: 17 %
Lymphs Abs: 1.3 10*3/uL (ref 0.7–4.0)
MCH: 30.3 pg (ref 26.0–34.0)
MCHC: 32.4 g/dL (ref 30.0–36.0)
MCV: 93.7 fL (ref 80.0–100.0)
Monocytes Absolute: 0.5 10*3/uL (ref 0.1–1.0)
Monocytes Relative: 6 %
Neutro Abs: 5.8 10*3/uL (ref 1.7–7.7)
Neutrophils Relative %: 74 %
Platelets: 111 10*3/uL — ABNORMAL LOW (ref 150–400)
RBC: 4.58 MIL/uL (ref 4.22–5.81)
RDW: 13 % (ref 11.5–15.5)
WBC: 7.8 10*3/uL (ref 4.0–10.5)
nRBC: 0 % (ref 0.0–0.2)

## 2022-09-02 LAB — FERRITIN: Ferritin: 27 ng/mL (ref 24–336)

## 2022-09-02 NOTE — Progress Notes (Signed)
[place] [facility] Day:  09/02/2022    Referring physician: No ref. provider found  Chief Complaint: Ryan Serrano is a 88 y.o. male with a monoclonal gammopathy and iron deficiency anemia who is seen for 6 month assessment.  Hematology history:  Ryan Serrano is a 88 y.o. male with iron deficiency and an IgA monoclonal gammopathy.  Hemoglobin has ranged from 11.2 - 11.5.   [place] labs from 09/26/2018 - 01/25/2019 revealed following normal studies:  B12, LDH, [place].  MMA was 17.8 (0.3 - 2.8).     Work-up on 07/04/2019: hematocrit 39.1, hemoglobin 12.2, MCV 89.7, platelets 137,000, WBC 7,400 with a normal differential.  Normal studies included: creatinine (1.04), calcium (9.1), LFTs, haptoglobin, LDH, folate, and TSH.  Ferritin was 12 with an iron saturation 15% and a TIBC of 395.  SPEP revealed a 0.4 gm/dL IgA monoclonal protein with kappa light chain specificity.   IgG was 878, IgA 527 (61-437), and IgM 44.  Kappa free light chains 74, lambda light chains 25.3 and ratio 2.92 (0.26 - 1.65) on 07/25/2019.  Beta-2 microglobulin was 2.0 (normal).  24-hour UPEP revealed no monoclonal protein with kappa/lambda free light chain ratio of > 15.72 (1.03 - 31.76).   He has a history of a diverticular bleed 3 years ago necessitating admission.  He had an EGD and colonoscopy in [place] about 2 years ago (no report available).  Per patient, there were polyps and no evidence of ulcer or gastritis. He has not undergone a capsule study.  Guaiac cards were negative on 01/06/2018.  Urinalysis was negative for blood on 12/12/2017.     HPI: Patient is 88 year old male with above history of MGUS and IDA who returns to clinic for follow up.    Patient returns for reevaluation and continued follow-up for history of MGUS and iron deficiency anemia.  He continues to feel at baseline and denies specific complaints.  Recently had colonoscopy.  Stays active and has new pigs and  new puppies.      Past Medical History:  Diagnosis Date   Diabetes mellitus without complication (HCC)    Diverticulitis    GERD (gastroesophageal reflux disease)    Heart murmur    High cholesterol    Hypertension    Kidney stones    Stroke [facility]) 2008   Wears dentures    Full upper and lower    Past Surgical History:  Procedure Laterality Date   BIOPSY N/A 08/04/2020   Procedure: BIOPSY;  Surgeon: Lucilla Lame, MD;  Location: [place];  Service: Endoscopy;  Laterality: N/A;   COLONOSCOPY WITH PROPOFOL N/A 08/04/2020   Procedure: COLONOSCOPY WITH PROPOFOL;  Surgeon: Lucilla Lame, MD;  Location: [place];  Service: Endoscopy;  Laterality: N/A;   ESOPHAGOGASTRODUODENOSCOPY (EGD) WITH PROPOFOL N/A 08/04/2020   Procedure: ESOPHAGOGASTRODUODENOSCOPY (EGD) WITH PROPOFOL;  Surgeon: Lucilla Lame, MD;  Location: [place];  Service: Endoscopy;  Laterality: N/A;  Diabetic - oral meds   ESOPHAGUS SURGERY     FINGER AMPUTATION Left    HEMORRHOID BANDING     HERNIA REPAIR     KIDNEY STONE SURGERY      Family History  Problem Relation Age of Onset   Hypertension Mother    Cancer Father    Diabetes Father    Hypertension Father     Social History:  reports that he quit smoking about 43 years ago. His smoking use included cigarettes. His smokeless tobacco  use includes chew. He reports current alcohol use. He reports that he does not use drugs. He currently works farming soybeans, corn, and wheat. He lives in [place]. He was been married for 55 years and has 4 children.  His wife died several. His daughter's name is [name] [phone]). He can be reached at [phone])  His nephew passed away  Allergies:  Allergies  Allergen Reactions   Hydrochlorothiazide     Other reaction(s): hyponatremia-stopped by cards   Penicillins Anaphylaxis and Other (See Comments)   Atorvastatin Other (See Comments)    Other reaction(s): myalgias 12-06    Bee Venom Other  (See Comments)   Hydrocodone Nausea And Vomiting   Hydrocodone-Acetaminophen Nausea And Vomiting    Current Medications: Current Outpatient Medications  Medication Sig Dispense Refill   acetaminophen (TYLENOL) 650 MG CR tablet Take 1,300 mg by mouth 2 (two) times daily.     aspirin EC 81 MG tablet Take 81 mg by mouth daily.      Blood Glucose Monitoring Suppl (TRUE METRIX METER) w/Device KIT      diclofenac sodium (VOLTAREN) 1 % GEL Apply 2 g topically as needed.      ferrous gluconate (FERGON) 240 (27 FE) MG tablet Take 240 mg by mouth every morning.     fluticasone (FLONASE) 50 MCG/ACT nasal spray 1 spray by Each Nare route daily.     gabapentin (NEURONTIN) 600 MG tablet Take 600 mg by mouth 3 (three) times daily.     metFORMIN (GLUCOPHAGE-XR) 500 MG 24 hr tablet Take 500 mg by mouth 2 (two) times daily.      Multiple Vitamins-Minerals (MULTIVITAMIN ADULTS 50+ PO) Take 1 tablet by mouth daily.     nicotine (NICODERM CQ - DOSED IN MG/24 HR) 7 mg/24hr patch 7 mg daily.     NON FORMULARY Take 2 capsules by mouth 2 (two) times daily. Omega XL takes bid     pantoprazole (PROTONIX) 40 MG tablet Take 40 mg by mouth daily.     simvastatin (ZOCOR) 20 MG tablet Take 20 mg by mouth daily at 6 PM.      tamsulosin (FLOMAX) 0.4 MG CAPS capsule Take 0.4 mg by mouth daily.      TRUE METRIX BLOOD GLUCOSE TEST test strip      vitamin B-12 (CYANOCOBALAMIN) 1000 MCG tablet Take 1,000 mcg by mouth daily.     [facility] Thin Lancets MISC Apply 1 each topically daily.     No current facility-administered medications for this visit.    Review of Systems  Constitutional:  Positive for malaise/fatigue. Negative for chills, diaphoresis, fever and weight loss.  HENT:  Negative for congestion, ear discharge, ear pain, nosebleeds, sinus pain and sore throat.   Eyes:  Negative for pain and redness.  Respiratory:  Negative for cough, hemoptysis, sputum production, shortness of breath, wheezing and stridor.    Cardiovascular:  Negative for chest pain, palpitations and leg swelling.  Gastrointestinal:  Negative for abdominal pain, constipation, diarrhea, nausea and vomiting.  Musculoskeletal:  Negative for falls, joint pain and myalgias.  Skin:  Negative for rash.  Neurological:  Negative for dizziness, loss of consciousness, weakness and headaches.  Psychiatric/Behavioral:  Negative for depression. The patient is not nervous/anxious and does not have insomnia.   All other systems reviewed and are negative.  Performance status (ECOG): 0-1  Vitals There were no vitals taken for this visit.   Physical Exam Vitals reviewed.  Constitutional:      Appearance: He is not  ill-appearing.  Cardiovascular:     Rate and Rhythm: Normal rate and regular rhythm.  Pulmonary:     Effort: Pulmonary effort is normal. No respiratory distress.  Abdominal:     General: There is no distension.     Palpations: Abdomen is soft.     Tenderness: There is no abdominal tenderness.  Skin:    Coloration: Skin is not jaundiced or pale.  Neurological:     Mental Status: He is alert and oriented to person, place, and time.  Psychiatric:        Behavior: Behavior normal.       Latest Ref Rng & Units 04/21/2021    2:20 PM 11/07/2020    5:14 PM 08/21/2020    8:37 AM  CBC  WBC 4.0 - 10.5 K/uL 6.6  10.5  7.6   Hemoglobin 13.0 - 17.0 g/dL 12.8  12.3  13.4   Hematocrit 39.0 - 52.0 % 39.5  38.6  40.2   Platelets 150 - 400 K/uL 115  126  150       Latest Ref Rng & Units 04/21/2021    2:20 PM 11/08/2020    1:29 PM 11/07/2020    5:14 PM  CMP  Glucose 70 - 99 mg/dL 221  88  129   BUN 8 - 23 mg/dL 15  13  15    Creatinine 0.61 - 1.24 mg/dL 1.09  1.10  1.49   Sodium 135 - 145 mmol/L 137  142  141   Potassium 3.5 - 5.1 mmol/L 4.0  4.3  4.2   Chloride 98 - 111 mmol/L 105  111  107   CO2 22 - 32 mmol/L 25  25  22    Calcium 8.9 - 10.3 mg/dL 8.9  8.6  8.5   Total Protein 6.5 - 8.1 g/dL 6.8   6.9   Total Bilirubin 0.3 -  1.2 mg/dL 0.5   0.7   Alkaline Phos 38 - 126 U/L 47   48   AST 15 - 41 U/L 16   18   ALT 0 - 44 U/L 10   10    Iron/TIBC/Ferritin/ %Sat    Component Value Date/Time   IRON 85 04/21/2021 1420   TIBC 328 04/21/2021 1420   FERRITIN 35 04/21/2021 1420   IRONPCTSAT 26 04/21/2021 1420   No visits with results within 3 Day(s) from this visit.  Latest known visit with results is:  Appointment on 04/21/2021  Component Date Value Ref Range Status   Iron 04/21/2021 85  45 - 182 ug/dL Final   TIBC 04/21/2021 328  250 - 450 ug/dL Final   Saturation Ratios 04/21/2021 26  17.9 - 39.5 % Final   UIBC 04/21/2021 243  ug/dL Final   Performed at [facility], [address] [place], [place] [address]   Ferritin 04/21/2021 35  24 - 336 ng/mL Final   Performed at [facility], [address] [place],  [address]   Total Protein ELP 04/21/2021 6.4  6.0 - 8.5 g/dL Final   Albumin ELP 04/21/2021 3.8  2.9 - 4.4 g/dL Final   Alpha-1-Globulin 04/21/2021 0.2  0.0 - 0.4 g/dL Final   Alpha-2-Globulin 04/21/2021 0.6  0.4 - 1.0 g/dL Final   Beta Globulin 04/21/2021 1.2  0.7 - 1.3 g/dL Final   Gamma Globulin 04/21/2021 0.6  0.4 - 1.8 g/dL Final   M-Spike, % 04/21/2021 0.2 (H)  Not Observed g/dL Final   SPE Interp. 04/21/2021 Comment  Final   Comment: (NOTE) Faint band in beta region suspicious for monoclonal immunoglobulin. This band may represent a benign spike as seen in older people or could be a paraprotein as seen in Multiple Myeloma, Waldenstrom's Macroglobulinemia or Lymphoma. Depending on clinical circumstances, further diagnostic studies may include serum immunofixation or serum free light chain quantitation. Performed At: [facility] [place], [place] [id number] Rush Farmer MD KP:[id number]    Comment 04/21/2021 Comment   Final   Comment: (NOTE) Protein electrophoresis scan will follow via computer, mail, or courier delivery.    Globulin,  Total 04/21/2021 2.6  2.2 - 3.9 g/dL Corrected   A/G Ratio 04/21/2021 1.5  0.7 - 1.7 Corrected   Sodium 04/21/2021 137  135 - 145 mmol/L Final   Potassium 04/21/2021 4.0  3.5 - 5.1 mmol/L Final   Chloride 04/21/2021 105  98 - 111 mmol/L Final   CO2 04/21/2021 25  22 - 32 mmol/L Final   Glucose, Bld 04/21/2021 221 (H)  70 - 99 mg/dL Final   Glucose reference range applies only to samples taken after fasting for at least 8 hours.   BUN 04/21/2021 15  8 - 23 mg/dL Final   Creatinine, Ser 04/21/2021 1.09  0.61 - 1.24 mg/dL Final   Calcium 04/21/2021 8.9  8.9 - 10.3 mg/dL Final   Total Protein 04/21/2021 6.8  6.5 - 8.1 g/dL Final   Albumin 04/21/2021 3.7  3.5 - 5.0 g/dL Final   AST 04/21/2021 16  15 - 41 U/L Final   ALT 04/21/2021 10  0 - 44 U/L Final   Alkaline Phosphatase 04/21/2021 47  38 - 126 U/L Final   Total Bilirubin 04/21/2021 0.5  0.3 - 1.2 mg/dL Final   GFR, Estimated 04/21/2021 >60  >60 mL/min Final   Comment: (NOTE) Calculated using the CKD-EPI Creatinine Equation (2021)    Anion gap 04/21/2021 7  5 - 15 Final   Performed at [facility], [address], [place], [place] [address]   WBC 04/21/2021 6.6  4.0 - 10.5 K/uL Final   RBC 04/21/2021 4.31  4.22 - 5.81 MIL/uL Final   Hemoglobin 04/21/2021 12.8 (L)  13.0 - 17.0 g/dL Final   HCT 04/21/2021 39.5  39.0 - 52.0 % Final   MCV 04/21/2021 91.6  80.0 - 100.0 fL Final   MCH 04/21/2021 29.7  26.0 - 34.0 pg Final   MCHC 04/21/2021 32.4  30.0 - 36.0 g/dL Final   RDW 04/21/2021 13.8  11.5 - 15.5 % Final   Platelets 04/21/2021 115 (L)  150 - 400 K/uL Final   Comment: Immature Platelet Fraction may be clinically indicated, consider ordering this additional test [id number]    nRBC 04/21/2021 0.0  0.0 - 0.2 % Final   Neutrophils Relative % 04/21/2021 69  % Final   Neutro Abs 04/21/2021 4.5  1.7 - 7.7 K/uL Final   Lymphocytes Relative 04/21/2021 21  % Final   Lymphs Abs 04/21/2021 1.4  0.7 - 4.0 K/uL Final   Monocytes  Relative 04/21/2021 7  % Final   Monocytes Absolute 04/21/2021 0.5  0.1 - 1.0 K/uL Final   Eosinophils Relative 04/21/2021 2  % Final   Eosinophils Absolute 04/21/2021 0.1  0.0 - 0.5 K/uL Final   Basophils Relative 04/21/2021 1  % Final   Basophils Absolute 04/21/2021 0.1  0.0 - 0.1 K/uL Final   Immature Granulocytes 04/21/2021 0  % Final   Abs Immature Granulocytes 04/21/2021 0.02  0.00 -  0.07 K/uL Final   Performed at [facility], [address], [place], [place] [address]    Assessment: Patient is 88 year old male who returns to clinic for follow-up for:  1.   Monoclonal gammopathy of unknown significance (MGUS)- IgA monoclonal gammopathy.  Labs pending today.  Previously, M spike was 0.4 g/dL.  UPEP previously revealed no monoclonal protein.  Did not meet crab criteria with no evidence of hypercalcemia, renal dysfunction, anemia, or bone lesions.  Recommend continued surveillance with labs in 6 months.  2. Iron deficiency anemia- s/p colonoscopy 08/06/20. Hmg 12.8. Stable. Iron studies pending at time of visit but he remains asymptomatic. Continue oral iron with vitamin c. Ferritin goal 100.   3. Thrombocytopenia- plt 111. Monitor  4. Anemia- secondary to iron deficiency. Less likely related to mgus. Monitor.   Plan:  RTC in 6 months for labs (cbc, cmp, spep, ferritin, iron studies). 1-2 weeks later see MD to establish care- la  I discussed the assessment and treatment plan with the patient.  The patient was provided an opportunity to ask questions and all were answered.  The patient agreed with the plan and demonstrated an understanding of the instructions.  The patient was advised to call back if the symptoms worsen or if the condition fails to improve as anticipated.  Beckey Rutter, DNP, AGNP-C [place] at [facility]

## 2022-09-03 LAB — KAPPA/LAMBDA LIGHT CHAINS
Kappa free light chain: 155.8 mg/L — ABNORMAL HIGH (ref 3.3–19.4)
Kappa, lambda light chain ratio: 7.79 — ABNORMAL HIGH (ref 0.26–1.65)
Lambda free light chains: 20 mg/L (ref 5.7–26.3)

## 2022-09-03 LAB — IGG, IGA, IGM
IgA: 611 mg/dL — ABNORMAL HIGH (ref 61–437)
IgG (Immunoglobin G), Serum: 797 mg/dL (ref 603–1613)
IgM (Immunoglobulin M), Srm: 47 mg/dL (ref 15–143)

## 2022-09-07 LAB — PROTEIN ELECTROPHORESIS, SERUM
A/G Ratio: 1.2 (ref 0.7–1.7)
Albumin ELP: 3.5 g/dL (ref 2.9–4.4)
Alpha-1-Globulin: 0.2 g/dL (ref 0.0–0.4)
Alpha-2-Globulin: 0.7 g/dL (ref 0.4–1.0)
Beta Globulin: 1.3 g/dL (ref 0.7–1.3)
Gamma Globulin: 0.7 g/dL (ref 0.4–1.8)
Globulin, Total: 2.9 g/dL (ref 2.2–3.9)
M-Spike, %: 0.5 g/dL — ABNORMAL HIGH
Total Protein ELP: 6.4 g/dL (ref 6.0–8.5)

## 2023-03-03 ENCOUNTER — Other Ambulatory Visit: Payer: Self-pay

## 2023-03-03 DIAGNOSIS — D509 Iron deficiency anemia, unspecified: Secondary | ICD-10-CM

## 2023-03-04 ENCOUNTER — Inpatient Hospital Stay: Payer: Medicare HMO | Attending: Internal Medicine

## 2023-03-10 ENCOUNTER — Other Ambulatory Visit: Payer: Self-pay | Admitting: *Deleted

## 2023-03-10 DIAGNOSIS — D509 Iron deficiency anemia, unspecified: Secondary | ICD-10-CM

## 2023-03-11 ENCOUNTER — Inpatient Hospital Stay: Payer: Medicare HMO | Admitting: Internal Medicine

## 2023-03-28 ENCOUNTER — Inpatient Hospital Stay: Payer: Medicare HMO | Attending: Internal Medicine

## 2023-03-28 DIAGNOSIS — Z87891 Personal history of nicotine dependence: Secondary | ICD-10-CM | POA: Insufficient documentation

## 2023-03-28 DIAGNOSIS — Z8673 Personal history of transient ischemic attack (TIA), and cerebral infarction without residual deficits: Secondary | ICD-10-CM | POA: Insufficient documentation

## 2023-03-28 DIAGNOSIS — D509 Iron deficiency anemia, unspecified: Secondary | ICD-10-CM | POA: Insufficient documentation

## 2023-03-28 DIAGNOSIS — D472 Monoclonal gammopathy: Secondary | ICD-10-CM | POA: Insufficient documentation

## 2023-04-04 ENCOUNTER — Inpatient Hospital Stay: Payer: Medicare HMO | Admitting: Internal Medicine

## 2023-04-05 ENCOUNTER — Inpatient Hospital Stay: Payer: Medicare HMO

## 2023-04-05 DIAGNOSIS — Z8673 Personal history of transient ischemic attack (TIA), and cerebral infarction without residual deficits: Secondary | ICD-10-CM | POA: Diagnosis not present

## 2023-04-05 DIAGNOSIS — D509 Iron deficiency anemia, unspecified: Secondary | ICD-10-CM | POA: Diagnosis not present

## 2023-04-05 DIAGNOSIS — D472 Monoclonal gammopathy: Secondary | ICD-10-CM | POA: Diagnosis present

## 2023-04-05 DIAGNOSIS — Z87891 Personal history of nicotine dependence: Secondary | ICD-10-CM | POA: Diagnosis not present

## 2023-04-05 LAB — CBC WITH DIFFERENTIAL/PLATELET
Abs Immature Granulocytes: 0.02 10*3/uL (ref 0.00–0.07)
Basophils Absolute: 0.1 10*3/uL (ref 0.0–0.1)
Basophils Relative: 1 %
Eosinophils Absolute: 0.2 10*3/uL (ref 0.0–0.5)
Eosinophils Relative: 3 %
HCT: 40.5 % (ref 39.0–52.0)
Hemoglobin: 12.9 g/dL — ABNORMAL LOW (ref 13.0–17.0)
Immature Granulocytes: 0 %
Lymphocytes Relative: 21 %
Lymphs Abs: 1.4 10*3/uL (ref 0.7–4.0)
MCH: 30.4 pg (ref 26.0–34.0)
MCHC: 31.9 g/dL (ref 30.0–36.0)
MCV: 95.5 fL (ref 80.0–100.0)
Monocytes Absolute: 0.6 10*3/uL (ref 0.1–1.0)
Monocytes Relative: 8 %
Neutro Abs: 4.5 10*3/uL (ref 1.7–7.7)
Neutrophils Relative %: 67 %
Platelets: 145 10*3/uL — ABNORMAL LOW (ref 150–400)
RBC: 4.24 MIL/uL (ref 4.22–5.81)
RDW: 14.2 % (ref 11.5–15.5)
WBC: 6.7 10*3/uL (ref 4.0–10.5)
nRBC: 0 % (ref 0.0–0.2)

## 2023-04-05 LAB — COMPREHENSIVE METABOLIC PANEL
ALT: 22 U/L (ref 0–44)
AST: 24 U/L (ref 15–41)
Albumin: 3.9 g/dL (ref 3.5–5.0)
Alkaline Phosphatase: 64 U/L (ref 38–126)
Anion gap: 9 (ref 5–15)
BUN: 16 mg/dL (ref 8–23)
CO2: 26 mmol/L (ref 22–32)
Calcium: 9 mg/dL (ref 8.9–10.3)
Chloride: 104 mmol/L (ref 98–111)
Creatinine, Ser: 1.26 mg/dL — ABNORMAL HIGH (ref 0.61–1.24)
GFR, Estimated: 55 mL/min — ABNORMAL LOW (ref >60)
Glucose, Bld: 185 mg/dL — ABNORMAL HIGH (ref 70–99)
Potassium: 3.7 mmol/L (ref 3.5–5.1)
Sodium: 139 mmol/L (ref 135–145)
Total Bilirubin: 0.6 mg/dL (ref 0.3–1.2)
Total Protein: 7.4 g/dL (ref 6.5–8.1)

## 2023-04-05 LAB — FERRITIN: Ferritin: 46 ng/mL (ref 24–336)

## 2023-04-12 LAB — MULTIPLE MYELOMA PANEL, SERUM
Albumin SerPl Elph-Mcnc: 3.9 g/dL (ref 2.9–4.4)
Albumin/Glob SerPl: 1.4 (ref 0.7–1.7)
Alpha 1: 0.3 g/dL (ref 0.0–0.4)
Alpha2 Glob SerPl Elph-Mcnc: 0.7 g/dL (ref 0.4–1.0)
B-Globulin SerPl Elph-Mcnc: 1.4 g/dL — ABNORMAL HIGH (ref 0.7–1.3)
Gamma Glob SerPl Elph-Mcnc: 0.7 g/dL (ref 0.4–1.8)
Globulin, Total: 3 g/dL (ref 2.2–3.9)
IgA: 669 mg/dL — ABNORMAL HIGH (ref 61–437)
IgG (Immunoglobin G), Serum: 806 mg/dL (ref 603–1613)
IgM (Immunoglobulin M), Srm: 44 mg/dL (ref 15–143)
M Protein SerPl Elph-Mcnc: 0.4 g/dL — ABNORMAL HIGH
Total Protein ELP: 6.9 g/dL (ref 6.0–8.5)

## 2023-04-14 ENCOUNTER — Inpatient Hospital Stay: Payer: Medicare HMO | Admitting: Internal Medicine

## 2023-04-19 ENCOUNTER — Inpatient Hospital Stay (HOSPITAL_BASED_OUTPATIENT_CLINIC_OR_DEPARTMENT_OTHER): Payer: Medicare HMO | Admitting: Internal Medicine

## 2023-04-19 ENCOUNTER — Encounter: Payer: Self-pay | Admitting: Internal Medicine

## 2023-04-19 DIAGNOSIS — D472 Monoclonal gammopathy: Secondary | ICD-10-CM | POA: Diagnosis not present

## 2023-04-19 NOTE — Assessment & Plan Note (Addendum)
#    Monoclonal gammopathy of unknown significance (MGUS)- IgA monoclonal gammopathy.   MAY 2024- M spike was 0.4 g/dL.  UPEP previously revealed no monoclonal protein.  Did not meet crab criteria with no evidence of hypercalcemia, or bone lesions.  except for mild anemia/renal dysfunction, Recommend continued surveillance with labs in 6 months.   2. Iron deficiency anemia- s/p colonoscopy 08/06/20. Hb 12.8. Stable.  Continue oral iron with vitamin c. Ferritin goal 100.    3. Thrombocytopenia- >100 stable. Monitor for now.    4. Anemia/CKD stage III- secondary to iron deficiency. Less likely related to mgus. Monitor.    # DISPOSITION: #  follow up 6 months in 6 months- MD;  prior 2 weekslabs (cbc, cmp, spep, ferritin, iron studies; MM panel; K/l light chains-Dr.B

## 2023-04-19 NOTE — Progress Notes (Signed)
Fatigue/weakness: yes Dyspena: no Light headedness: no  Blood in stool: no  C/o back and hip pain, using otc pain patches.

## 2023-04-19 NOTE — Progress Notes (Signed)
[facility]  [address] [place], [place] [address] Phone: [phone]  Fax: [phone]   Clinic Day:  04/19/2023    Referring physician: No ref. provider found  Chief Complaint: Ryan Serrano is a 89 y.o. male with a monoclonal gammopathy and iron deficiency anemia who is seen for 6 month assessment.  Hematology history:  Ryan Serrano is a 89 y.o. male with iron deficiency and an IgA monoclonal gammopathy.  Hemoglobin has ranged from 11.2 - 11.5.   [facility] labs from 09/26/2018 - 01/25/2019 revealed following normal studies:  B12, LDH, Coombs.  MMA was 17.8 (0.3 - 2.8).     Work-up on 07/04/2019: hematocrit 39.1, hemoglobin 12.2, MCV 89.7, platelets 137,000, WBC 7,400 with a normal differential.  Normal studies included: creatinine (1.04), calcium (9.1), LFTs, haptoglobin, LDH, folate, and TSH.  Ferritin was 12 with an iron saturation 15% and a TIBC of 395.  SPEP revealed a 0.4 gm/dL IgA monoclonal protein with kappa light chain specificity.   IgG was 878, IgA 527 (61-437), and IgM 44.  Kappa free light chains 74, lambda light chains 25.3 and ratio 2.92 (0.26 - 1.65) on 07/25/2019.  Beta-2 microglobulin was 2.0 (normal).  24-hour UPEP revealed no monoclonal protein with kappa/lambda free light chain ratio of > 15.72 (1.03 - 31.76).   SPEP has been followed (gm/dL): 0.4 on 52/84/1324, 0.1 on 11/11/2019, and 0.4 on 05/21/2020.  He has a history of a diverticular bleed 3 years ago necessitating admission.  He had an EGD and colonoscopy in [place] about 2 years ago (no report available).  Per patient, there were polyps and no evidence of ulcer or gastritis. He has not undergone a capsule study.  Guaiac cards were negative on 01/06/2018.  Urinalysis was negative for blood on 12/12/2017.    He has iron deficiency.  Ferritin was 12 on 07/04/2019, 20 on 08/21/2019, and 26 on 11/21/2019.   He is on oral B12.  The patient received his COVID-19 vaccines on  01/18/2020 and in 01/2020  HPI: Patient returns for reevaluation and continued follow-up for history of MGUS and iron deficiency anemia.    He continues to feel at baseline and denies specific complaints.  No dizziness or weakness.  No headaches, numbness or tingling, or changes in vision or hearing.  No abnormal bleeding or bruising.    No abdominal pain, changes in bowel movements, nausea or vomiting.  No fever or night sweats.  No unintentional weight loss.  No fatigue or weakness. Chronic joint pain but no new bone pain.  No other specific complaints today.   Past Medical History:  Diagnosis Date   CHF (congestive heart failure) (HCC)    Diabetes mellitus without complication (HCC)    Diverticulitis    GERD (gastroesophageal reflux disease)    Heart murmur    High cholesterol    Hypertension    Kidney stones    Stroke [facility]) 2008   Wears dentures    Full upper and lower    Past Surgical History:  Procedure Laterality Date   BIOPSY N/A 08/04/2020   Procedure: BIOPSY;  Surgeon: Midge Minium, MD;  Location: [facility];  Service: Endoscopy;  Laterality: N/A;   COLONOSCOPY WITH PROPOFOL N/A 08/04/2020   Procedure: COLONOSCOPY WITH PROPOFOL;  Surgeon: Midge Minium, MD;  Location: [facility];  Service: Endoscopy;  Laterality: N/A;   ESOPHAGOGASTRODUODENOSCOPY (EGD) WITH PROPOFOL N/A 08/04/2020   Procedure: ESOPHAGOGASTRODUODENOSCOPY (EGD) WITH PROPOFOL;  Surgeon: Midge Minium, MD;  Location: [facility];  Service: Endoscopy;  Laterality: N/A;  Diabetic - oral meds   ESOPHAGUS SURGERY     FINGER AMPUTATION Left    HEMORRHOID BANDING     HERNIA REPAIR     KIDNEY STONE SURGERY      Family History  Problem Relation Age of Onset   Hypertension Mother    Cancer Father    Diabetes Father    Hypertension Father     Social History:  reports that he quit smoking about 44 years ago. His smoking use included cigarettes. His smokeless tobacco use includes chew. He  reports current alcohol use. He reports that he does not use drugs. He denies any known exposure to radiation or toxins. He currently works farming soybeans, corn, and wheat. He lives in [place]. He was been married for 55 years and has 4 children.  His wife died 2 years ago.  His daughter's name is Marchelle Folks [phone]). He can be reached at [phone])  His nephew passed away.  The patient is alone today.  Allergies:  Allergies  Allergen Reactions   Hydrochlorothiazide     Other reaction(s): hyponatremia-stopped by cards   Penicillins Anaphylaxis and Other (See Comments)   Atorvastatin Other (See Comments)    Other reaction(s): myalgias 12-06    Bee Venom Other (See Comments)   Hydrocodone Nausea And Vomiting   Hydrocodone-Acetaminophen Nausea And Vomiting    Current Medications: Current Outpatient Medications  Medication Sig Dispense Refill   acetaminophen (TYLENOL) 650 MG CR tablet Take 1,300 mg by mouth 2 (two) times daily.     aspirin EC 81 MG tablet Take 81 mg by mouth daily.      Blood Glucose Monitoring Suppl (TRUE METRIX METER) w/Device KIT      COZAAR 25 MG tablet Take 25 mg by mouth daily.     diclofenac sodium (VOLTAREN) 1 % GEL Apply 2 g topically as needed.      ferrous gluconate (FERGON) 240 (27 FE) MG tablet Take 240 mg by mouth every morning.     fluticasone (FLONASE) 50 MCG/ACT nasal spray 1 spray by Each Nare route daily.     furosemide (LASIX) 20 MG tablet Take 20 mg by mouth 2 (two) times daily.     gabapentin (NEURONTIN) 600 MG tablet Take 600 mg by mouth 3 (three) times daily.     JARDIANCE 10 MG TABS tablet Take 10 mg by mouth daily.     metFORMIN (GLUCOPHAGE-XR) 500 MG 24 hr tablet Take 500 mg by mouth 2 (two) times daily.      metoprolol succinate (TOPROL-XL) 25 MG 24 hr tablet Take 1 tablet by mouth daily.     Multiple Vitamins-Minerals (MULTIVITAMIN ADULTS 50+ PO) Take 1 tablet by mouth daily.     nicotine (NICODERM CQ - DOSED IN MG/24 HR) 7 mg/24hr  patch 7 mg daily.     NON FORMULARY Take 2 capsules by mouth 2 (two) times daily. Omega XL takes bid     pantoprazole (PROTONIX) 40 MG tablet Take 40 mg by mouth daily.     rosuvastatin (CRESTOR) 20 MG tablet Take 20 mg by mouth at bedtime.     simvastatin (ZOCOR) 20 MG tablet Take 20 mg by mouth daily at 6 PM.      tamsulosin (FLOMAX) 0.4 MG CAPS capsule Take 0.4 mg by mouth daily.      TRUE METRIX BLOOD GLUCOSE TEST test strip      vitamin B-12 (CYANOCOBALAMIN) 1000 MCG tablet  Take 1,000 mcg by mouth daily.     [facility] Thin Lancets MISC Apply 1 each topically daily.     No current facility-administered medications for this visit.    Review of Systems  Constitutional:  Negative for chills, fever, malaise/fatigue and weight loss.  HENT:  Negative for hearing loss, nosebleeds, sore throat and tinnitus.   Eyes:  Negative for blurred vision and double vision.  Respiratory:  Negative for cough, hemoptysis, shortness of breath and wheezing.   Cardiovascular:  Negative for chest pain, palpitations and leg swelling.  Gastrointestinal:  Negative for abdominal pain, blood in stool, constipation, diarrhea, melena, nausea and vomiting.  Genitourinary:  Negative for dysuria, hematuria and urgency.  Musculoskeletal:  Negative for back pain, falls, joint pain and myalgias.  Skin:  Negative for itching and rash.  Neurological:  Negative for dizziness, tingling, sensory change, loss of consciousness, weakness and headaches.  Endo/Heme/Allergies:  Negative for environmental allergies. Does not bruise/bleed easily.  Psychiatric/Behavioral:  Negative for depression. The patient is not nervous/anxious and does not have insomnia.    Performance status (ECOG): 0-1  Vitals Blood pressure 124/70, pulse 71, temperature (!) 96.7 F (35.9 C), temperature source Tympanic, height 5\' 8"  (1.727 m), weight 158 lb (71.7 kg), SpO2 98 %.   Physical Exam Vitals reviewed.  Constitutional:      Appearance: He is not  ill-appearing.     Comments: unaccompanied  HENT:     Head: Normocephalic and atraumatic.  Eyes:     General: No scleral icterus.    Conjunctiva/sclera: Conjunctivae normal.  Cardiovascular:     Rate and Rhythm: Normal rate and regular rhythm.  Pulmonary:     Effort: Pulmonary effort is normal.     Breath sounds: Normal breath sounds.  Abdominal:     General: Abdomen is flat. There is no distension.     Tenderness: There is no abdominal tenderness.  Musculoskeletal:        General: No deformity. Normal range of motion.     Cervical back: Neck supple.  Lymphadenopathy:     Cervical: No cervical adenopathy.  Skin:    General: Skin is warm and dry.  Neurological:     General: No focal deficit present.     Mental Status: He is alert and oriented to person, place, and time.  Psychiatric:        Mood and Affect: Mood normal.        Behavior: Behavior normal.     No visits with results within 3 Day(s) from this visit.  Latest known visit with results is:  Appointment on 04/05/2023  Component Date Value Ref Range Status   Ferritin 04/05/2023 46  24 - 336 ng/mL Final   Performed at [facility], [address] [place], [place] [address]   IgG (Immunoglobin G), Serum 04/05/2023 806  603 - 1,613 mg/dL Final   IgA 91/47/8295 669 (H)  61 - 437 mg/dL Final   IgM (Immunoglobulin M), Srm 04/05/2023 44  15 - 143 mg/dL Final   Total Protein ELP 04/05/2023 6.9  6.0 - 8.5 g/dL Corrected   Albumin SerPl Elph-Mcnc 04/05/2023 3.9  2.9 - 4.4 g/dL Corrected   Alpha 1 62/13/0865 0.3  0.0 - 0.4 g/dL Corrected   Alpha2 Glob SerPl Elph-Mcnc 04/05/2023 0.7  0.4 - 1.0 g/dL Corrected   B-Globulin SerPl Elph-Mcnc 04/05/2023 1.4 (H)  0.7 - 1.3 g/dL Corrected   Gamma Glob SerPl Elph-Mcnc 04/05/2023 0.7  0.4 - 1.8 g/dL Corrected  M Protein SerPl Elph-Mcnc 04/05/2023 0.4 (H)  Not Observed g/dL Corrected   Globulin, Total 04/05/2023 3.0  2.2 - 3.9 g/dL Corrected   Albumin/Glob SerPl 04/05/2023  1.4  0.7 - 1.7 Corrected   IFE 1 04/05/2023 Comment (A)   Corrected   Comment: (NOTE) Immunofixation shows IgA monoclonal protein with kappa light chain specificity. Immunofixation shows IgG monoclonal protein with kappa light chain specificity. PLEASE NOTE: Samples from patients receiving DARZALEX(R) (daratumumab) or SARCLISA(R)(isatuximab-irfc) treatment can appear as an "IgG kappa" and mask a complete response (CR). If this patient is receiving these therapies, this IFE assay interference can be removed by ordering test number 123218-"Immunofixation, Daratumumab-Specific, Serum" or 123062-"Immunofixation, Isatuximab-Specific, Serum" and submitting a new sample for testing or by calling the lab to add this test to the current sample.    Please Note 04/05/2023 Comment   Corrected   Comment: (NOTE) Protein electrophoresis scan will follow via computer, mail, or courier delivery. Performed At: [facility] [address] [place], [place] [id number] Jolene Schimke MD [id number]    Sodium 04/05/2023 139  135 - 145 mmol/L Final   Potassium 04/05/2023 3.7  3.5 - 5.1 mmol/L Final   Chloride 04/05/2023 104  98 - 111 mmol/L Final   CO2 04/05/2023 26  22 - 32 mmol/L Final   Glucose, Bld 04/05/2023 185 (H)  70 - 99 mg/dL Final   Glucose reference range applies only to samples taken after fasting for at least 8 hours.   BUN 04/05/2023 16  8 - 23 mg/dL Final   Creatinine, Ser 04/05/2023 1.26 (H)  0.61 - 1.24 mg/dL Final   Calcium 86/57/8469 9.0  8.9 - 10.3 mg/dL Final   Total Protein 62/95/2841 7.4  6.5 - 8.1 g/dL Final   Albumin 32/44/0102 3.9  3.5 - 5.0 g/dL Final   AST 72/53/6644 24  15 - 41 U/L Final   ALT 04/05/2023 22  0 - 44 U/L Final   Alkaline Phosphatase 04/05/2023 64  38 - 126 U/L Final   Total Bilirubin 04/05/2023 0.6  0.3 - 1.2 mg/dL Final   GFR, Estimated 04/05/2023 55 (L)  >60 mL/min Final   Comment: (NOTE) Calculated using the CKD-EPI Creatinine Equation  (2021)    Anion gap 04/05/2023 9  5 - 15 Final   Performed at [facility], [address] [place], [place] [address]   WBC 04/05/2023 6.7  4.0 - 10.5 K/uL Final   RBC 04/05/2023 4.24  4.22 - 5.81 MIL/uL Final   Hemoglobin 04/05/2023 12.9 (L)  13.0 - 17.0 g/dL Final   HCT 25/95/6387 40.5  39.0 - 52.0 % Final   MCV 04/05/2023 95.5  80.0 - 100.0 fL Final   MCH 04/05/2023 30.4  26.0 - 34.0 pg Final   MCHC 04/05/2023 31.9  30.0 - 36.0 g/dL Final   RDW 56/43/3295 14.2  11.5 - 15.5 % Final   Platelets 04/05/2023 145 (L)  150 - 400 K/uL Final   nRBC 04/05/2023 0.0  0.0 - 0.2 % Final   Neutrophils Relative % 04/05/2023 67  % Final   Neutro Abs 04/05/2023 4.5  1.7 - 7.7 K/uL Final   Lymphocytes Relative 04/05/2023 21  % Final   Lymphs Abs 04/05/2023 1.4  0.7 - 4.0 K/uL Final   Monocytes Relative 04/05/2023 8  % Final   Monocytes Absolute 04/05/2023 0.6  0.1 - 1.0 K/uL Final   Eosinophils Relative 04/05/2023 3  % Final   Eosinophils Absolute 04/05/2023 0.2  0.0 -  0.5 K/uL Final   Basophils Relative 04/05/2023 1  % Final   Basophils Absolute 04/05/2023 0.1  0.0 - 0.1 K/uL Final   Immature Granulocytes 04/05/2023 0  % Final   Abs Immature Granulocytes 04/05/2023 0.02  0.00 - 0.07 K/uL Final   Performed at [facility], [address] [place], [place] [address]    Assessment: Patient is 89 year old male who returns to clinic for follow-up for:  1.   Monoclonal gammopathy of unknown significance (MGUS)- IgA monoclonal gammopathy.  Labs pending today.  Previously, M spike was 0.4 g/dL.  UPEP previously revealed no monoclonal protein.  Did not meet crab criteria with no evidence of hypercalcemia, renal dysfunction, anemia, or bone lesions.  Recommend continued surveillance with labs in 6 months. 2. Iron deficiency anemia- s/p colonoscopy 08/06/20. Iron studies pending today but hemoglobin stable to slightly improved. Continue oral iron with vitamin C.  Ferritin goal 100.   Plan:   RTC in 6 months for labs (cbc, cmp, spep, ferritin, iron studies) & MD/NP   I discussed the assessment and treatment plan with the patient.  The patient was provided an opportunity to ask questions and all were answered.  The patient agreed with the plan and demonstrated an understanding of the instructions.  The patient was advised to call back if the symptoms worsen or if the condition fails to improve as anticipated.  Consuello Masse, DNP, AGNP-[facility]

## 2023-09-29 ENCOUNTER — Inpatient Hospital Stay: Payer: Medicare HMO | Attending: Internal Medicine

## 2023-10-13 ENCOUNTER — Ambulatory Visit: Payer: Medicare HMO | Admitting: Internal Medicine

## 2023-10-24 ENCOUNTER — Inpatient Hospital Stay: Payer: Medicare HMO

## 2023-10-26 ENCOUNTER — Inpatient Hospital Stay: Payer: Medicare HMO | Attending: Internal Medicine

## 2023-10-26 DIAGNOSIS — D472 Monoclonal gammopathy: Secondary | ICD-10-CM | POA: Insufficient documentation

## 2023-10-26 DIAGNOSIS — D509 Iron deficiency anemia, unspecified: Secondary | ICD-10-CM | POA: Insufficient documentation

## 2023-10-26 DIAGNOSIS — D696 Thrombocytopenia, unspecified: Secondary | ICD-10-CM | POA: Diagnosis not present

## 2023-10-26 DIAGNOSIS — Z87891 Personal history of nicotine dependence: Secondary | ICD-10-CM | POA: Insufficient documentation

## 2023-10-26 DIAGNOSIS — E1122 Type 2 diabetes mellitus with diabetic chronic kidney disease: Secondary | ICD-10-CM | POA: Insufficient documentation

## 2023-10-26 DIAGNOSIS — N183 Chronic kidney disease, stage 3 unspecified: Secondary | ICD-10-CM | POA: Insufficient documentation

## 2023-10-26 LAB — CBC WITH DIFFERENTIAL (CANCER CENTER ONLY)
Abs Immature Granulocytes: 0.02 10*3/uL (ref 0.00–0.07)
Basophils Absolute: 0 10*3/uL (ref 0.0–0.1)
Basophils Relative: 0 %
Eosinophils Absolute: 0.1 10*3/uL (ref 0.0–0.5)
Eosinophils Relative: 1 %
HCT: 41.6 % (ref 39.0–52.0)
Hemoglobin: 13.4 g/dL (ref 13.0–17.0)
Immature Granulocytes: 0 %
Lymphocytes Relative: 17 %
Lymphs Abs: 1.1 10*3/uL (ref 0.7–4.0)
MCH: 30.2 pg (ref 26.0–34.0)
MCHC: 32.2 g/dL (ref 30.0–36.0)
MCV: 93.9 fL (ref 80.0–100.0)
Monocytes Absolute: 0.6 10*3/uL (ref 0.1–1.0)
Monocytes Relative: 9 %
Neutro Abs: 4.9 10*3/uL (ref 1.7–7.7)
Neutrophils Relative %: 73 %
Platelet Count: 112 10*3/uL — ABNORMAL LOW (ref 150–400)
RBC: 4.43 MIL/uL (ref 4.22–5.81)
RDW: 14.8 % (ref 11.5–15.5)
Smear Review: NORMAL
WBC Count: 6.8 10*3/uL (ref 4.0–10.5)
nRBC: 0 % (ref 0.0–0.2)

## 2023-10-26 LAB — CMP (CANCER CENTER ONLY)
ALT: 12 U/L (ref 0–44)
AST: 19 U/L (ref 15–41)
Albumin: 3.5 g/dL (ref 3.5–5.0)
Alkaline Phosphatase: 60 U/L (ref 38–126)
Anion gap: 10 (ref 5–15)
BUN: 14 mg/dL (ref 8–23)
CO2: 24 mmol/L (ref 22–32)
Calcium: 8.7 mg/dL — ABNORMAL LOW (ref 8.9–10.3)
Chloride: 105 mmol/L (ref 98–111)
Creatinine: 1.26 mg/dL — ABNORMAL HIGH (ref 0.61–1.24)
GFR, Estimated: 55 mL/min — ABNORMAL LOW (ref >60)
Glucose, Bld: 175 mg/dL — ABNORMAL HIGH (ref 70–99)
Potassium: 3.9 mmol/L (ref 3.5–5.1)
Sodium: 139 mmol/L (ref 135–145)
Total Bilirubin: 0.6 mg/dL (ref <1.2)
Total Protein: 6.9 g/dL (ref 6.5–8.1)

## 2023-10-26 LAB — IRON AND TIBC
Iron: 61 ug/dL (ref 45–182)
Saturation Ratios: 19 % (ref 17.9–39.5)
TIBC: 318 ug/dL (ref 250–450)
UIBC: 257 ug/dL

## 2023-10-26 LAB — FERRITIN: Ferritin: 24 ng/mL (ref 24–336)

## 2023-10-27 LAB — KAPPA/LAMBDA LIGHT CHAINS
Kappa free light chain: 203.4 mg/L — ABNORMAL HIGH (ref 3.3–19.4)
Kappa, lambda light chain ratio: 9.46 — ABNORMAL HIGH (ref 0.26–1.65)
Lambda free light chains: 21.5 mg/L (ref 5.7–26.3)

## 2023-10-31 LAB — PROTEIN ELECTROPHORESIS, SERUM
A/G Ratio: 1.2 (ref 0.7–1.7)
Albumin ELP: 3.5 g/dL (ref 2.9–4.4)
Alpha-1-Globulin: 0.3 g/dL (ref 0.0–0.4)
Alpha-2-Globulin: 0.7 g/dL (ref 0.4–1.0)
Beta Globulin: 1.5 g/dL — ABNORMAL HIGH (ref 0.7–1.3)
Gamma Globulin: 0.7 g/dL (ref 0.4–1.8)
Globulin, Total: 3 g/dL (ref 2.2–3.9)
M-Spike, %: 0.6 g/dL — ABNORMAL HIGH
Total Protein ELP: 6.5 g/dL (ref 6.0–8.5)

## 2023-11-01 LAB — MULTIPLE MYELOMA PANEL, SERUM
Albumin SerPl Elph-Mcnc: 3.5 g/dL (ref 2.9–4.4)
Albumin/Glob SerPl: 1.3 (ref 0.7–1.7)
Alpha 1: 0.3 g/dL (ref 0.0–0.4)
Alpha2 Glob SerPl Elph-Mcnc: 0.6 g/dL (ref 0.4–1.0)
B-Globulin SerPl Elph-Mcnc: 1.3 g/dL (ref 0.7–1.3)
Gamma Glob SerPl Elph-Mcnc: 0.6 g/dL (ref 0.4–1.8)
Globulin, Total: 2.8 g/dL (ref 2.2–3.9)
IgA: 768 mg/dL — ABNORMAL HIGH (ref 61–437)
IgG (Immunoglobin G), Serum: 804 mg/dL (ref 603–1613)
IgM (Immunoglobulin M), Srm: 44 mg/dL (ref 15–143)
M Protein SerPl Elph-Mcnc: 0.5 g/dL — ABNORMAL HIGH
Total Protein ELP: 6.3 g/dL (ref 6.0–8.5)

## 2023-11-07 ENCOUNTER — Inpatient Hospital Stay: Payer: Medicare HMO | Admitting: Internal Medicine

## 2023-11-07 ENCOUNTER — Encounter: Payer: Self-pay | Admitting: Internal Medicine

## 2023-11-07 VITALS — BP 148/66 | HR 80 | Temp 96.6°F | Resp 18 | Wt 161.2 lb

## 2023-11-07 DIAGNOSIS — D472 Monoclonal gammopathy: Secondary | ICD-10-CM | POA: Diagnosis not present

## 2023-11-07 NOTE — Assessment & Plan Note (Addendum)
#    Monoclonal gammopathy of unknown significance (MGUS)- IgA monoclonal gammopathy.   MAY 2024- M spike was 0.5 g/dL; Kappa [161]/WRUEAV ratio; 9.  UPEP previously revealed no monoclonal protein.  Did not meet crab criteria with no evidence of hypercalcemia, or bone lesions.  except for mild anemia/renal dysfunction, Recommend continued surveillance with labs in 6 months.   # iron deficiency anemia- s/p colonoscopy 08/06/20. Hb 12.8. Stable.  Continue oral iron with vitamin c. Ferritin goal 100.    # Thrombocytopenia- >100 Monitor for now-  stable.   # CKD stage III-likely DM/HNT/age related- Less likely related to myeloma-  stable.   # DISPOSITION: #  follow up 6 months - MD;  prior 2 weekslabs (cbc, cmp, spep, ferritin, iron studies; MM panel; K/l light chains-Dr.B

## 2023-11-07 NOTE — Progress Notes (Signed)
[facility] CONSULT NOTE  Patient Care Team: Jonette Pesa, MD (Inactive) as PCP - General (Family Medicine) Ashok Pall, Wilfred Curtis, MD as Consulting Physician (Obstetrics and Gynecology) Earna Coder, MD as Consulting Physician (Oncology)  CHIEF COMPLAINTS/PURPOSE OF CONSULTATION: MGUS.   Chief Complaint: Ryan Serrano is a 89 y.o. male with a monoclonal gammopathy and iron deficiency anemia who is seen for 6 month assessment.   Hematology history:  Derell Bent is a 89 y.o. male with iron deficiency and an IgA monoclonal gammopathy.  Hemoglobin has ranged from 11.2 - 11.5.   [facility] labs from 09/26/2018 - 01/25/2019 revealed following normal studies:  B12, LDH, Coombs.  MMA was 17.8 (0.3 - 2.8).     Work-up on 07/04/2019: hematocrit 39.1, hemoglobin 12.2, MCV 89.7, platelets 137,000, WBC 7,400 with a normal differential.  Normal studies included: creatinine (1.04), calcium (9.1), LFTs, haptoglobin, LDH, folate, and TSH.  Ferritin was 12 with an iron saturation 15% and a TIBC of 395.  SPEP revealed a 0.4 gm/dL IgA monoclonal protein with kappa light chain specificity.   IgG was 878, IgA 527 (61-437), and IgM 44.  Kappa free light chains 74, lambda light chains 25.3 and ratio 2.92 (0.26 - 1.65) on 07/25/2019.  Beta-2 microglobulin was 2.0 (normal).  24-hour UPEP revealed no monoclonal protein with kappa/lambda free light chain ratio of > 15.72 (1.03 - 31.76).   SPEP has been followed (gm/dL): 0.4 on 19/14/7829, 0.1 on 11/11/2019, and 0.4 on 05/21/2020.   He has a history of a diverticular bleed 3 years ago necessitating admission.  He had an EGD and colonoscopy in [place] about 2 years ago (no report available).  Per patient, there were polyps and no evidence of ulcer or gastritis. He has not undergone a capsule study.  Guaiac cards were negative on 01/06/2018.  Urinalysis was negative for blood on 12/12/2017.    He has iron deficiency.  Ferritin was 12 on 07/04/2019, 20  on 08/21/2019, and 26 on 11/21/2019.   He is on oral B12.   The patient received his COVID-19 vaccines on 01/18/2020 and in 01/2020   HPI: Patient returns for reevaluation and continued follow-up for history of MGUS and iron deficiency anemia.    HISTORY OF PRESENTING ILLNESS: Patient ambulating-independently.  Alone.  Ryan Serrano 89 y.o.  male pleasant patient with hx of MGUS is here for a follow up.   Patient complains of generalized joint pains. Hands and joints swell. Good appetite. Chronic mild fatigue.   Feels lightheaded sometimes in the mornings. No falls. No blood in stool. Dyspnea is mild with exertion.   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   Review of Systems  Constitutional:  Positive for malaise/fatigue. Negative for chills, diaphoresis, fever and weight loss.  HENT:  Negative for nosebleeds and sore throat.   Eyes:  Negative for double vision.  Respiratory:  Negative for cough, hemoptysis, sputum production, shortness of breath and wheezing.   Cardiovascular:  Negative for chest pain, palpitations, orthopnea and leg swelling.  Gastrointestinal:  Negative for abdominal pain, blood in stool, constipation, diarrhea, heartburn, melena, nausea and vomiting.  Genitourinary:  Negative for dysuria, frequency and urgency.  Musculoskeletal:  Negative for back pain and joint pain.  Skin: Negative.  Negative for itching and rash.  Neurological:  Negative for dizziness, tingling, focal weakness, weakness and headaches.  Endo/Heme/Allergies:  Does not bruise/bleed easily.  Psychiatric/Behavioral:  Negative for depression. The patient is not nervous/anxious  and does not have insomnia.     MEDICAL HISTORY:  Past Medical History:  Diagnosis Date   CHF (congestive heart failure) (HCC)    Diabetes mellitus without complication (HCC)    Diverticulitis    GERD (gastroesophageal reflux disease)    Heart murmur    High cholesterol    Hypertension    Kidney  stones    Stroke [facility]) 2008   Wears dentures    Full upper and lower    SURGICAL HISTORY: Past Surgical History:  Procedure Laterality Date   BIOPSY N/A 08/04/2020   Procedure: BIOPSY;  Surgeon: Midge Minium, MD;  Location: [facility];  Service: Endoscopy;  Laterality: N/A;   COLONOSCOPY WITH PROPOFOL N/A 08/04/2020   Procedure: COLONOSCOPY WITH PROPOFOL;  Surgeon: Midge Minium, MD;  Location: [facility];  Service: Endoscopy;  Laterality: N/A;   ESOPHAGOGASTRODUODENOSCOPY (EGD) WITH PROPOFOL N/A 08/04/2020   Procedure: ESOPHAGOGASTRODUODENOSCOPY (EGD) WITH PROPOFOL;  Surgeon: Midge Minium, MD;  Location: [facility];  Service: Endoscopy;  Laterality: N/A;  Diabetic - oral meds   ESOPHAGUS SURGERY     FINGER AMPUTATION Left    HEMORRHOID BANDING     HERNIA REPAIR     KIDNEY STONE SURGERY      SOCIAL HISTORY: Social History   Socioeconomic History   Marital status: Widowed    Spouse name: Not on file   Number of children: Not on file   Years of education: Not on file   Highest education level: Not on file  Occupational History   Not on file  Tobacco Use   Smoking status: Former    Current packs/day: 0.00    Types: Cigarettes    Start date: 1964    Quit date: 77    Years since quitting: 44.9   Smokeless tobacco: Current    Types: Chew  Vaping Use   Vaping status: Never Used  Substance and Sexual Activity   Alcohol use: Yes   Drug use: Never   Sexual activity: Not on file  Other Topics Concern   Not on file  Social History Narrative   Not on file   Social Drivers of Health   Financial Resource Strain: Low Risk  (07/22/2021)   Received from [facility], [facility]   Overall Financial Resource Strain (CARDIA)    Difficulty of Paying Living Expenses: Not very hard  Food Insecurity: No Food Insecurity (07/22/2021)   Received from [facility], [facility]   Hunger Vital Sign    Worried About Running Out of Food in the Last  Year: Never true    Ran Out of Food in the Last Year: Never true  Transportation Needs: No Transportation Needs (07/22/2021)   Received from [facility], [facility]   [facility] - Transportation    Lack of Transportation (Medical): No    Lack of Transportation (Non-Medical): No  Physical Activity: Not on file  Stress: Not on file  Social Connections: Not on file  Intimate Partner Violence: Not on file    FAMILY HISTORY: Family History  Problem Relation Age of Onset   Hypertension Mother    Cancer Father    Diabetes Father    Hypertension Father     ALLERGIES:  is allergic to hydrochlorothiazide, penicillins, atorvastatin, bee venom, hydrocodone, and hydrocodone-acetaminophen.  MEDICATIONS:  Current Outpatient Medications  Medication Sig Dispense Refill   acetaminophen (TYLENOL) 650 MG CR tablet Take 1,300 mg by mouth 2 (two) times daily.  aspirin EC 81 MG tablet Take 81 mg by mouth daily.      Blood Glucose Monitoring Suppl (TRUE METRIX METER) w/Device KIT      COZAAR 25 MG tablet Take 25 mg by mouth daily.     diclofenac sodium (VOLTAREN) 1 % GEL Apply 2 g topically as needed.      ferrous gluconate (FERGON) 240 (27 FE) MG tablet Take 240 mg by mouth every morning.     fluticasone (FLONASE) 50 MCG/ACT nasal spray 1 spray by Each Nare route daily.     furosemide (LASIX) 20 MG tablet Take 20 mg by mouth 2 (two) times daily.     gabapentin (NEURONTIN) 600 MG tablet Take 600 mg by mouth 3 (three) times daily.     JARDIANCE 10 MG TABS tablet Take 10 mg by mouth daily.     metFORMIN (GLUCOPHAGE-XR) 500 MG 24 hr tablet Take 500 mg by mouth 2 (two) times daily.      metoprolol succinate (TOPROL-XL) 25 MG 24 hr tablet Take 1 tablet by mouth daily.     Multiple Vitamins-Minerals (MULTIVITAMIN ADULTS 50+ PO) Take 1 tablet by mouth daily.     NON FORMULARY Take 2 capsules by mouth 2 (two) times daily. Omega XL takes bid     pantoprazole (PROTONIX) 40 MG tablet Take 40 mg by  mouth daily.     rosuvastatin (CRESTOR) 20 MG tablet Take 20 mg by mouth at bedtime.     simvastatin (ZOCOR) 20 MG tablet Take 20 mg by mouth daily at 6 PM.      tamsulosin (FLOMAX) 0.4 MG CAPS capsule Take 0.4 mg by mouth daily.      TRUE METRIX BLOOD GLUCOSE TEST test strip      vitamin B-12 (CYANOCOBALAMIN) 1000 MCG tablet Take 1,000 mcg by mouth daily.     [facility] Thin Lancets MISC Apply 1 each topically daily.     No current facility-administered medications for this visit.    PHYSICAL EXAMINATION:   Vitals:   11/07/23 1308  BP: (!) 148/66  Pulse: 80  Resp: 18  Temp: (!) 96.6 F (35.9 C)  SpO2: 100%   Filed Weights   11/07/23 1308  Weight: 161 lb 3.2 oz (73.1 kg)    Physical Exam Vitals and nursing note reviewed.  HENT:     Head: Normocephalic and atraumatic.     Mouth/Throat:     Pharynx: Oropharynx is clear.  Eyes:     Extraocular Movements: Extraocular movements intact.     Pupils: Pupils are equal, round, and reactive to light.  Cardiovascular:     Rate and Rhythm: Normal rate and regular rhythm.  Pulmonary:     Comments: Decreased breath sounds bilaterally.  Abdominal:     Palpations: Abdomen is soft.  Musculoskeletal:        General: Normal range of motion.     Cervical back: Normal range of motion.  Skin:    General: Skin is warm.  Neurological:     General: No focal deficit present.     Mental Status: He is alert and oriented to person, place, and time.  Psychiatric:        Behavior: Behavior normal.        Judgment: Judgment normal.     LABORATORY DATA:  I have reviewed the data as listed Lab Results  Component Value Date   WBC 6.8 10/26/2023   HGB 13.4 10/26/2023   HCT 41.6 10/26/2023   MCV 93.9 10/26/2023  PLT 112 (L) 10/26/2023   Recent Labs    04/05/23 1317 10/26/23 1340  NA 139 139  K 3.7 3.9  CL 104 105  CO2 26 24  GLUCOSE 185* 175*  BUN 16 14  CREATININE 1.26* 1.26*  CALCIUM 9.0 8.7*  GFRNONAA 55* 55*  PROT 7.4  6.9  ALBUMIN 3.9 3.5  AST 24 19  ALT 22 12  ALKPHOS 64 60  BILITOT 0.6 0.6    RADIOGRAPHIC STUDIES: I have personally reviewed the radiological images as listed and agreed with the findings in the report. No results found.   Monoclonal gammopathy #  Monoclonal gammopathy of unknown significance (MGUS)- IgA monoclonal gammopathy.   MAY 2024- M spike was 0.5 g/dL; Kappa [932]/TFTDDU ratio; 9.  UPEP previously revealed no monoclonal protein.  Did not meet crab criteria with no evidence of hypercalcemia, or bone lesions.  except for mild anemia/renal dysfunction, Recommend continued surveillance with labs in 6 months.   # iron deficiency anemia- s/p colonoscopy 08/06/20. Hb 12.8. Stable.  Continue oral iron with vitamin c. Ferritin goal 100.    # Thrombocytopenia- >100 Monitor for now-  stable.   # CKD stage III-likely DM/HNT/age related- Less likely related to myeloma-  stable.   # DISPOSITION: #  follow up 6 months - MD;  prior 2 weekslabs (cbc, cmp, spep, ferritin, iron studies; MM panel; K/l light chains-Dr.B    Above plan of care was discussed with patient/family in detail.  My contact information was given to the patient/family.       Earna Coder, MD 11/07/2023 1:38 PM

## 2023-11-07 NOTE — Progress Notes (Signed)
Has generalized joint pains. Hands and joints swell. Good appetite. Has no energy. Feels lightheaded sometimes in the mornings. No falls. No blood in stool. Dyspnea is mild with exertion. 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

## 2024-04-23 ENCOUNTER — Inpatient Hospital Stay: Payer: Medicare HMO

## 2024-05-07 ENCOUNTER — Inpatient Hospital Stay: Payer: Medicare HMO | Admitting: Internal Medicine

## 2024-05-07 ENCOUNTER — Other Ambulatory Visit

## 2024-05-15 ENCOUNTER — Inpatient Hospital Stay

## 2024-05-15 ENCOUNTER — Inpatient Hospital Stay: Admitting: Internal Medicine

## 2024-06-08 ENCOUNTER — Inpatient Hospital Stay: Attending: Internal Medicine

## 2024-06-08 ENCOUNTER — Encounter: Payer: Self-pay | Admitting: Internal Medicine

## 2024-06-08 ENCOUNTER — Inpatient Hospital Stay (HOSPITAL_BASED_OUTPATIENT_CLINIC_OR_DEPARTMENT_OTHER): Admitting: Internal Medicine

## 2024-06-08 DIAGNOSIS — D509 Iron deficiency anemia, unspecified: Secondary | ICD-10-CM | POA: Diagnosis not present

## 2024-06-08 DIAGNOSIS — N183 Chronic kidney disease, stage 3 unspecified: Secondary | ICD-10-CM | POA: Diagnosis not present

## 2024-06-08 DIAGNOSIS — D472 Monoclonal gammopathy: Secondary | ICD-10-CM | POA: Insufficient documentation

## 2024-06-08 DIAGNOSIS — D696 Thrombocytopenia, unspecified: Secondary | ICD-10-CM | POA: Diagnosis not present

## 2024-06-08 DIAGNOSIS — Z87891 Personal history of nicotine dependence: Secondary | ICD-10-CM | POA: Insufficient documentation

## 2024-06-08 LAB — CBC WITH DIFFERENTIAL (CANCER CENTER ONLY)
Abs Immature Granulocytes: 0.02 K/uL (ref 0.00–0.07)
Basophils Absolute: 0.1 K/uL (ref 0.0–0.1)
Basophils Relative: 1 %
Eosinophils Absolute: 0.1 K/uL (ref 0.0–0.5)
Eosinophils Relative: 1 %
HCT: 45.4 % (ref 39.0–52.0)
Hemoglobin: 14.1 g/dL (ref 13.0–17.0)
Immature Granulocytes: 0 %
Lymphocytes Relative: 19 %
Lymphs Abs: 1.3 K/uL (ref 0.7–4.0)
MCH: 29.2 pg (ref 26.0–34.0)
MCHC: 31.1 g/dL (ref 30.0–36.0)
MCV: 94 fL (ref 80.0–100.0)
Monocytes Absolute: 0.6 K/uL (ref 0.1–1.0)
Monocytes Relative: 8 %
Neutro Abs: 5 K/uL (ref 1.7–7.7)
Neutrophils Relative %: 71 %
Platelet Count: 121 K/uL — ABNORMAL LOW (ref 150–400)
RBC: 4.83 MIL/uL (ref 4.22–5.81)
RDW: 13.5 % (ref 11.5–15.5)
WBC Count: 7.1 K/uL (ref 4.0–10.5)
nRBC: 0 % (ref 0.0–0.2)

## 2024-06-08 LAB — FERRITIN: Ferritin: 31 ng/mL (ref 24–336)

## 2024-06-08 LAB — CMP (CANCER CENTER ONLY)
ALT: 14 U/L (ref 0–44)
AST: 17 U/L (ref 15–41)
Albumin: 4 g/dL (ref 3.5–5.0)
Alkaline Phosphatase: 66 U/L (ref 38–126)
Anion gap: 10 (ref 5–15)
BUN: 14 mg/dL (ref 8–23)
CO2: 24 mmol/L (ref 22–32)
Calcium: 9.1 mg/dL (ref 8.9–10.3)
Chloride: 104 mmol/L (ref 98–111)
Creatinine: 1.17 mg/dL (ref 0.61–1.24)
GFR, Estimated: 60 mL/min — ABNORMAL LOW (ref >60)
Glucose, Bld: 150 mg/dL — ABNORMAL HIGH (ref 70–99)
Potassium: 3.8 mmol/L (ref 3.5–5.1)
Sodium: 138 mmol/L (ref 135–145)
Total Bilirubin: 0.8 mg/dL (ref 0.0–1.2)
Total Protein: 7.5 g/dL (ref 6.5–8.1)

## 2024-06-08 LAB — IRON AND TIBC
Iron: 141 ug/dL (ref 45–182)
Saturation Ratios: 43 % — ABNORMAL HIGH (ref 17.9–39.5)
TIBC: 329 ug/dL (ref 250–450)
UIBC: 188 ug/dL

## 2024-06-08 NOTE — Assessment & Plan Note (Addendum)
#    Monoclonal gammopathy of unknown significance (MGUS)- IgA monoclonal gammopathy.   JAN 2025- M spike was 0.5 g/dL; Kappa [799]/ojfaij ratio; 9.  UPEP previously revealed no monoclonal protein. Again reviewed  that does not  meet CRAB criteria with no evidence of hypercalcemia, or bone lesions.  except for mild anemia/renal dysfunction, MM panel/K-L light chains pending.  Recommend continued surveillance with labs in 6 months.   # iron deficiency anemia- s/p colonoscopy 08/06/20. Hb 12.8.   Continue oral iron with vitamin c. Ferritin goal 100- Stable.   # Thrombocytopenia- >100 Monitor for now-  stable.   # CKD stage III-likely DM/HNT/age related- Less likely related to myeloma-  stable.   # DISPOSITION: #  follow up 6 months - MD;  prior 2 weeks- labs (cbc, cmp, spep, ferritin, iron studies; MM panel; K/l light chains-Dr.B

## 2024-06-08 NOTE — Progress Notes (Signed)
 Echo of heart, Dr. Sonda HOUSTON. No concerns today.

## 2024-06-08 NOTE — Progress Notes (Signed)
 [facility] CONSULT NOTE  Patient Care Team: Ryan Mai, MD (Inactive) as PCP - General (Family Medicine) [place], Ryan Sayres, MD as Consulting Physician (Obstetrics and Gynecology) Ryan Cindy SAUNDERS, MD as Consulting Physician (Oncology)  CHIEF COMPLAINTS/PURPOSE OF CONSULTATION: MGUS.   Chief Complaint: Ryan Serrano is a 89 y.o. male with a monoclonal gammopathy and iron deficiency anemia who is seen for 6 month assessment.   Hematology history:  Ryan Serrano is a 89 y.o. male with iron deficiency and an IgA monoclonal gammopathy.  Hemoglobin has ranged from 11.2 - 11.5.   [facility] labs from 09/26/2018 - 01/25/2019 revealed following normal studies:  B12, LDH, Coombs.  MMA was 17.8 (0.3 - 2.8).     Work-up on 07/04/2019: hematocrit 39.1, hemoglobin 12.2, MCV 89.7, platelets 137,000, WBC 7,400 with a normal differential.  Normal studies included: creatinine (1.04), calcium (9.1), LFTs, haptoglobin, LDH, folate, and TSH.  Ferritin was 12 with an iron saturation 15% and a TIBC of 395.  SPEP revealed a 0.4 gm/dL IgA monoclonal protein with kappa light chain specificity.   IgG was 878, IgA 527 (61-437), and IgM 44.  Kappa free light chains 74, lambda light chains 25.3 and ratio 2.92 (0.26 - 1.65) on 07/25/2019.  Beta-2  microglobulin was 2.0 (normal).  24-hour UPEP revealed no monoclonal protein with kappa/lambda free light chain ratio of > 15.72 (1.03 - 31.76).   SPEP has been followed (gm/dL): 0.4 on 91/87/7979, 0.1 on 11/11/2019, and 0.4 on 05/21/2020.   He has a history of a diverticular bleed 3 years ago necessitating admission.  He had an EGD and colonoscopy in [place] about 2 years ago (no report available).  Per patient, there were polyps and no evidence of ulcer or gastritis. He has not undergone a capsule study.  Guaiac cards were negative on 01/06/2018.  Urinalysis was negative for blood on 12/12/2017.    He has iron deficiency.  Ferritin was 12 on 07/04/2019, 20  on 08/21/2019, and 26 on 11/21/2019.   He is on oral B12.   The patient received his COVID-19 vaccines on 01/18/2020 and in 01/2020   HPI: Patient returns for reevaluation and continued follow-up for history of MGUS and iron deficiency anemia.    HISTORY OF PRESENTING ILLNESS: Patient ambulating-independently.  With daughter.   Ryan Serrano 89 y.o.  male pleasant patient with hx of MGUS is here for a follow up.   Good appetite. Chronic mild fatigue.  Feels lightheaded sometimes in the mornings. No falls. No blood in stool.   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   Review of Systems  Constitutional:  Positive for malaise/fatigue. Negative for chills, diaphoresis, fever and weight loss.  HENT:  Negative for nosebleeds and sore throat.   Eyes:  Negative for double vision.  Respiratory:  Negative for cough, hemoptysis, sputum production, shortness of breath and wheezing.   Cardiovascular:  Negative for chest pain, palpitations, orthopnea and leg swelling.  Gastrointestinal:  Negative for abdominal pain, blood in stool, constipation, diarrhea, heartburn, melena, nausea and vomiting.  Genitourinary:  Negative for dysuria, frequency and urgency.  Musculoskeletal:  Negative for back pain and joint pain.  Skin: Negative.  Negative for itching and rash.  Neurological:  Negative for dizziness, tingling, focal weakness, weakness and headaches.  Endo/Heme/Allergies:  Does not bruise/bleed easily.  Psychiatric/Behavioral:  Negative for depression. The patient is not nervous/anxious and does not have insomnia.     MEDICAL HISTORY:  Past Medical  History:  Diagnosis Date   CHF (congestive heart failure) (HCC)    Diabetes mellitus without complication (HCC)    Diverticulitis    GERD (gastroesophageal reflux disease)    Heart murmur    High cholesterol    Hypertension    Kidney stones    Stroke [facility]) 2008   Wears dentures    Full upper and lower    SURGICAL  HISTORY: Past Surgical History:  Procedure Laterality Date   BIOPSY N/A 08/04/2020   Procedure: BIOPSY;  Surgeon: Ryan Carmine, MD;  Location: [facility];  Service: Endoscopy;  Laterality: N/A;   COLONOSCOPY WITH PROPOFOL  N/A 08/04/2020   Procedure: COLONOSCOPY WITH PROPOFOL ;  Surgeon: Ryan Carmine, MD;  Location: [facility];  Service: Endoscopy;  Laterality: N/A;   ESOPHAGOGASTRODUODENOSCOPY (EGD) WITH PROPOFOL  N/A 08/04/2020   Procedure: ESOPHAGOGASTRODUODENOSCOPY (EGD) WITH PROPOFOL ;  Surgeon: Ryan Carmine, MD;  Location: [facility];  Service: Endoscopy;  Laterality: N/A;  Diabetic - oral meds   ESOPHAGUS SURGERY     FINGER AMPUTATION Left    HEMORRHOID BANDING     HERNIA REPAIR     KIDNEY STONE SURGERY      SOCIAL HISTORY: Social History   Socioeconomic History   Marital status: Widowed    Spouse name: Not on file   Number of children: Not on file   Years of education: Not on file   Highest education level: Not on file  Occupational History   Not on file  Tobacco Use   Smoking status: Former    Current packs/day: 0.00    Types: Cigarettes    Start date: 1964    Quit date: 64    Years since quitting: 45.5   Smokeless tobacco: Current    Types: Chew  Vaping Use   Vaping status: Never Used  Substance and Sexual Activity   Alcohol use: Yes   Drug use: Never   Sexual activity: Not on file  Other Topics Concern   Not on file  Social History Narrative   Not on file   Social Drivers of Health   Financial Resource Strain: Low Risk  (07/22/2021)   Received from [facility]   Overall Financial Resource Strain (CARDIA)    Difficulty of Paying Living Expenses: Not very hard  Food Insecurity: No Food Insecurity (07/22/2021)   Received from [facility]   Hunger Vital Sign    Within the past 12 months, you worried that your food would run out before you got the money to buy more.: Never true    Within the past 12 months, the food you bought  just didn't last and you didn't have money to get more.: Never true  Transportation Needs: No Transportation Needs (07/22/2021)   Received from [facility]   PRAPARE - Transportation    Lack of Transportation (Medical): No    Lack of Transportation (Non-Medical): No  Physical Activity: Not on file  Stress: Not on file  Social Connections: Not on file  Intimate Partner Violence: Not on file    FAMILY HISTORY: Family History  Problem Relation Age of Onset   Hypertension Mother    Cancer Father    Diabetes Father    Hypertension Father     ALLERGIES:  is allergic to hydrochlorothiazide, penicillins, atorvastatin, bee venom, hydrocodone, and hydrocodone-acetaminophen .  MEDICATIONS:  Current Outpatient Medications  Medication Sig Dispense Refill   acetaminophen  (TYLENOL ) 650 MG CR tablet Take 1,300 mg by mouth 2 (two) times daily.  aspirin  EC 81 MG tablet Take 81 mg by mouth daily.      Blood Glucose Monitoring Suppl (TRUE METRIX METER) w/Device KIT      diclofenac sodium (VOLTAREN) 1 % GEL Apply 2 g topically as needed.      empagliflozin (JARDIANCE) 25 MG TABS tablet Take 25 mg by mouth daily.     ferrous gluconate (FERGON) 240 (27 FE) MG tablet Take 240 mg by mouth every morning. (Patient taking differently: Take by mouth every morning. 65 mg every other day)     fluticasone (FLONASE) 50 MCG/ACT nasal spray 1 spray by Each Nare route daily.     furosemide (LASIX) 20 MG tablet Take 20 mg by mouth 2 (two) times daily.     gabapentin (NEURONTIN) 600 MG tablet Take 600 mg by mouth 3 (three) times daily.     losartan (COZAAR) 25 MG tablet Take 25 mg by mouth daily.     metoprolol succinate (TOPROL-XL) 25 MG 24 hr tablet Take 1 tablet by mouth daily.     Multiple Vitamins-Minerals (MULTIVITAMIN ADULTS 50+ PO) Take 1 tablet by mouth daily.     NON FORMULARY Take 2 capsules by mouth 2 (two) times daily. Omega XL takes bid     pantoprazole  (PROTONIX ) 40 MG tablet Take 40 mg by mouth  daily.     rosuvastatin (CRESTOR) 20 MG tablet Take 20 mg by mouth at bedtime.     tamsulosin (FLOMAX) 0.4 MG CAPS capsule Take 0.4 mg by mouth daily.      TRUE METRIX BLOOD GLUCOSE TEST test strip      vitamin B-12 (CYANOCOBALAMIN ) 1000 MCG tablet Take 1,000 mcg by mouth daily.     [facility] Thin Lancets MISC Apply 1 each topically daily.     No current facility-administered medications for this visit.    PHYSICAL EXAMINATION:   Vitals:   06/08/24 1003 06/08/24 1026  BP: (!) 153/89 (!) 170/89  Pulse: (!) 57   Resp: 16   Temp: (!) 96 F (35.6 C)   SpO2: 100%    Filed Weights   06/08/24 1003  Weight: 159 lb 8 oz (72.3 kg)    Physical Exam Vitals and nursing note reviewed.  HENT:     Head: Normocephalic and atraumatic.     Mouth/Throat:     Pharynx: Oropharynx is clear.  Eyes:     Extraocular Movements: Extraocular movements intact.     Pupils: Pupils are equal, round, and reactive to light.  Cardiovascular:     Rate and Rhythm: Normal rate and regular rhythm.  Pulmonary:     Comments: Decreased breath sounds bilaterally.  Abdominal:     Palpations: Abdomen is soft.  Musculoskeletal:        General: Normal range of motion.     Cervical back: Normal range of motion.  Skin:    General: Skin is warm.  Neurological:     General: No focal deficit present.     Mental Status: He is alert and oriented to person, place, and time.  Psychiatric:        Behavior: Behavior normal.        Judgment: Judgment normal.     LABORATORY DATA:  I have reviewed the data as listed Lab Results  Component Value Date   WBC 7.1 06/08/2024   HGB 14.1 06/08/2024   HCT 45.4 06/08/2024   MCV 94.0 06/08/2024   PLT 121 (L) 06/08/2024   Recent Labs    10/26/23 1340 06/08/24  0959  NA 139 138  K 3.9 3.8  CL 105 104  CO2 24 24  GLUCOSE 175* 150*  BUN 14 14  CREATININE 1.26* 1.17  CALCIUM 8.7* 9.1  GFRNONAA 55* 60*  PROT 6.9 7.5  ALBUMIN 3.5 4.0  AST 19 17  ALT 12 14   ALKPHOS 60 66  BILITOT 0.6 0.8    RADIOGRAPHIC STUDIES: I have personally reviewed the radiological images as listed and agreed with the findings in the report. No results found.   Monoclonal gammopathy #  Monoclonal gammopathy of unknown significance (MGUS)- IgA monoclonal gammopathy.   JAN 2025- M spike was 0.5 g/dL; Kappa [799]/ojfaij ratio; 9.  UPEP previously revealed no monoclonal protein. Again reviewed  that does not  meet CRAB criteria with no evidence of hypercalcemia, or bone lesions.  except for mild anemia/renal dysfunction, MM panel/K-L light chains pending.  Recommend continued surveillance with labs in 6 months.   # iron deficiency anemia- s/p colonoscopy 08/06/20. Hb 12.8.   Continue oral iron with vitamin c. Ferritin goal 100- Stable.   # Thrombocytopenia- >100 Monitor for now-  stable.   # CKD stage III-likely DM/HNT/age related- Less likely related to myeloma-  stable.   # DISPOSITION: #  follow up 6 months - MD;  prior 2 weeks- labs (cbc, cmp, spep, ferritin, iron studies; MM panel; K/l light chains-Dr.B     Above plan of care was discussed with patient/family in detail.  My contact information was given to the patient/family.       Cindy JONELLE Joe, MD 06/08/2024 11:27 AM

## 2024-06-11 LAB — KAPPA/LAMBDA LIGHT CHAINS
Kappa free light chain: 203.9 mg/L — ABNORMAL HIGH (ref 3.3–19.4)
Kappa, lambda light chain ratio: 10.85 — ABNORMAL HIGH (ref 0.26–1.65)
Lambda free light chains: 18.8 mg/L (ref 5.7–26.3)

## 2024-06-11 LAB — MULTIPLE MYELOMA PANEL, SERUM
Albumin SerPl Elph-Mcnc: 3.5 g/dL (ref 2.9–4.4)
Albumin/Glob SerPl: 1.1 (ref 0.7–1.7)
Alpha 1: 0.3 g/dL (ref 0.0–0.4)
Alpha2 Glob SerPl Elph-Mcnc: 0.7 g/dL (ref 0.4–1.0)
B-Globulin SerPl Elph-Mcnc: 1.7 g/dL — ABNORMAL HIGH (ref 0.7–1.3)
Gamma Glob SerPl Elph-Mcnc: 0.8 g/dL (ref 0.4–1.8)
Globulin, Total: 3.5 g/dL (ref 2.2–3.9)
IgA: 1086 mg/dL — ABNORMAL HIGH (ref 61–437)
IgG (Immunoglobin G), Serum: 872 mg/dL (ref 603–1613)
IgM (Immunoglobulin M), Srm: 51 mg/dL (ref 15–143)
M Protein SerPl Elph-Mcnc: 0.8 g/dL — ABNORMAL HIGH
Total Protein ELP: 7 g/dL (ref 6.0–8.5)

## 2024-06-13 LAB — PROTEIN ELECTROPHORESIS, SERUM, WITH REFLEX
A/G Ratio: 1 (ref 0.7–1.7)
Albumin ELP: 3.6 g/dL (ref 2.9–4.4)
Alpha-1-Globulin: 0.3 g/dL (ref 0.0–0.4)
Alpha-2-Globulin: 0.7 g/dL (ref 0.4–1.0)
Beta Globulin: 1.7 g/dL — ABNORMAL HIGH (ref 0.7–1.3)
Gamma Globulin: 0.8 g/dL (ref 0.4–1.8)
Globulin, Total: 3.5 g/dL (ref 2.2–3.9)
M-Spike, %: 0.7 g/dL — ABNORMAL HIGH
SPEP Interpretation: 0
Total Protein ELP: 7.1 g/dL (ref 6.0–8.5)

## 2024-06-13 LAB — IMMUNOFIXATION REFLEX, SERUM
IgA: 1031 mg/dL — ABNORMAL HIGH (ref 61–437)
IgG (Immunoglobin G), Serum: 899 mg/dL (ref 603–1613)
IgM (Immunoglobulin M), Srm: 42 mg/dL (ref 15–143)

## 2024-08-06 ENCOUNTER — Other Ambulatory Visit: Payer: Self-pay | Admitting: Obstetrics and Gynecology

## 2024-12-03 ENCOUNTER — Encounter: Payer: Self-pay | Admitting: Internal Medicine

## 2024-12-10 ENCOUNTER — Inpatient Hospital Stay: Attending: Internal Medicine

## 2024-12-10 DIAGNOSIS — R972 Elevated prostate specific antigen [PSA]: Secondary | ICD-10-CM | POA: Diagnosis not present

## 2024-12-10 DIAGNOSIS — D509 Iron deficiency anemia, unspecified: Secondary | ICD-10-CM | POA: Insufficient documentation

## 2024-12-10 DIAGNOSIS — N183 Chronic kidney disease, stage 3 unspecified: Secondary | ICD-10-CM | POA: Insufficient documentation

## 2024-12-10 DIAGNOSIS — D472 Monoclonal gammopathy: Secondary | ICD-10-CM | POA: Insufficient documentation

## 2024-12-10 DIAGNOSIS — D696 Thrombocytopenia, unspecified: Secondary | ICD-10-CM | POA: Diagnosis not present

## 2024-12-10 LAB — CBC WITH DIFFERENTIAL (CANCER CENTER ONLY)
Abs Immature Granulocytes: 0.03 K/uL (ref 0.00–0.07)
Basophils Absolute: 0.1 K/uL (ref 0.0–0.1)
Basophils Relative: 1 %
Eosinophils Absolute: 0.1 K/uL (ref 0.0–0.5)
Eosinophils Relative: 1 %
HCT: 45.2 % (ref 39.0–52.0)
Hemoglobin: 14.6 g/dL (ref 13.0–17.0)
Immature Granulocytes: 0 %
Lymphocytes Relative: 18 %
Lymphs Abs: 1.5 K/uL (ref 0.7–4.0)
MCH: 30.4 pg (ref 26.0–34.0)
MCHC: 32.3 g/dL (ref 30.0–36.0)
MCV: 94 fL (ref 80.0–100.0)
Monocytes Absolute: 0.6 K/uL (ref 0.1–1.0)
Monocytes Relative: 7 %
Neutro Abs: 6.2 K/uL (ref 1.7–7.7)
Neutrophils Relative %: 73 %
Platelet Count: 123 K/uL — ABNORMAL LOW (ref 150–400)
RBC: 4.81 MIL/uL (ref 4.22–5.81)
RDW: 12.9 % (ref 11.5–15.5)
WBC Count: 8.5 K/uL (ref 4.0–10.5)
nRBC: 0 % (ref 0.0–0.2)

## 2024-12-10 LAB — CMP (CANCER CENTER ONLY)
ALT: 12 U/L (ref 0–44)
AST: 18 U/L (ref 15–41)
Albumin: 4 g/dL (ref 3.5–5.0)
Alkaline Phosphatase: 81 U/L (ref 38–126)
Anion gap: 11 (ref 5–15)
BUN: 16 mg/dL (ref 8–23)
CO2: 26 mmol/L (ref 22–32)
Calcium: 9.2 mg/dL (ref 8.9–10.3)
Chloride: 102 mmol/L (ref 98–111)
Creatinine: 1.26 mg/dL — ABNORMAL HIGH (ref 0.61–1.24)
GFR, Estimated: 55 mL/min — ABNORMAL LOW
Glucose, Bld: 251 mg/dL — ABNORMAL HIGH (ref 70–99)
Potassium: 4 mmol/L (ref 3.5–5.1)
Sodium: 139 mmol/L (ref 135–145)
Total Bilirubin: 0.6 mg/dL (ref 0.0–1.2)
Total Protein: 7.5 g/dL (ref 6.5–8.1)

## 2024-12-10 LAB — IRON AND TIBC
Iron: 80 ug/dL (ref 45–182)
Saturation Ratios: 27 % (ref 17.9–39.5)
TIBC: 294 ug/dL (ref 250–450)
UIBC: 214 ug/dL

## 2024-12-10 LAB — FERRITIN: Ferritin: 78 ng/mL (ref 24–336)

## 2024-12-11 LAB — PROTEIN ELECTROPHORESIS, SERUM
A/G Ratio: 1.1 (ref 0.7–1.7)
Albumin ELP: 3.5 g/dL (ref 2.9–4.4)
Alpha-1-Globulin: 0.2 g/dL (ref 0.0–0.4)
Alpha-2-Globulin: 0.6 g/dL (ref 0.4–1.0)
Beta Globulin: 1.7 g/dL — ABNORMAL HIGH (ref 0.7–1.3)
Gamma Globulin: 0.7 g/dL (ref 0.4–1.8)
Globulin, Total: 3.3 g/dL (ref 2.2–3.9)
M-Spike, %: 0.8 g/dL — ABNORMAL HIGH
Total Protein ELP: 6.8 g/dL (ref 6.0–8.5)

## 2024-12-11 LAB — KAPPA/LAMBDA LIGHT CHAINS
Kappa free light chain: 246.3 mg/L — ABNORMAL HIGH (ref 3.3–19.4)
Kappa, lambda light chain ratio: 11.51 — ABNORMAL HIGH (ref 0.26–1.65)
Lambda free light chains: 21.4 mg/L (ref 5.7–26.3)

## 2024-12-12 LAB — MULTIPLE MYELOMA PANEL, SERUM
Albumin SerPl Elph-Mcnc: 3.4 g/dL (ref 2.9–4.4)
Albumin/Glob SerPl: 1 (ref 0.7–1.7)
Alpha 1: 0.3 g/dL (ref 0.0–0.4)
Alpha2 Glob SerPl Elph-Mcnc: 0.8 g/dL (ref 0.4–1.0)
B-Globulin SerPl Elph-Mcnc: 1.9 g/dL — ABNORMAL HIGH (ref 0.7–1.3)
Gamma Glob SerPl Elph-Mcnc: 0.7 g/dL (ref 0.4–1.8)
Globulin, Total: 3.6 g/dL (ref 2.2–3.9)
IgA: 1222 mg/dL — ABNORMAL HIGH (ref 61–437)
IgG (Immunoglobin G), Serum: 914 mg/dL (ref 603–1613)
IgM (Immunoglobulin M), Srm: 42 mg/dL (ref 15–143)
M Protein SerPl Elph-Mcnc: 1 g/dL — ABNORMAL HIGH
Total Protein ELP: 7 g/dL (ref 6.0–8.5)

## 2024-12-23 ENCOUNTER — Telehealth: Payer: Self-pay | Admitting: Internal Medicine

## 2024-12-23 NOTE — Telephone Encounter (Signed)
 Voicemail left for patient to call back to reschedule 2/2 appointment due to clinic close

## 2024-12-24 ENCOUNTER — Inpatient Hospital Stay: Admitting: Internal Medicine

## 2024-12-25 ENCOUNTER — Inpatient Hospital Stay: Admitting: Internal Medicine

## 2024-12-25 ENCOUNTER — Encounter: Payer: Self-pay | Admitting: Internal Medicine

## 2024-12-25 VITALS — BP 122/78 | HR 61 | Temp 96.9°F | Resp 17 | Wt 157.0 lb

## 2024-12-25 DIAGNOSIS — D472 Monoclonal gammopathy: Secondary | ICD-10-CM | POA: Diagnosis not present

## 2024-12-25 DIAGNOSIS — R972 Elevated prostate specific antigen [PSA]: Secondary | ICD-10-CM

## 2024-12-25 DIAGNOSIS — R9389 Abnormal findings on diagnostic imaging of other specified body structures: Secondary | ICD-10-CM

## 2024-12-25 NOTE — Progress Notes (Signed)
 Patient doing well. Appetite is good. Has energy. Wants to get out and go to work. Has some right hip pain. Takes metamucil as needed.  Denies headache or dizziness. Has roaring/ringing in his ears after lying down for a long time. Daughter asked if we received referral from [facility] for his prostate. I told her not yet. His PSA in Nov was 15.43. MRI was also done. No biopsy has been scheduled. Have copy for Dr Rennie. The urologist at [facility] is Dr Powell Saras.

## 2024-12-25 NOTE — Assessment & Plan Note (Addendum)
#    Monoclonal gammopathy of unknown significance (MGUS)- IgA monoclonal gammopathy.   JAN 2026- M spike was 1.0 g/dL; Kappa [756]/ojfaij ratio; 11.  Myeloma labs slowly rising since 2020; but not significantly abnormal in the last 1 year.  # Again reviewed  that does not  meet CRAB criteria with no evidence of hypercalcemia, or bone lesions.  except for mild anemia/renal dysfunction.   Recommend continued surveillance with labs in 6 months.  # Elevated PSA-screening 15-18.  Patient is asymptomatic.  [NOV 2025]- MRI prostate- concerning for PI-RADS- 5; [facility]]- refer to [facility]    # iron deficiency anemia- s/p colonoscopy 08/06/20. Hb 12.8.   Continue oral iron with vitamin c. Ferritin goal 100- Stable.   # Thrombocytopenia- >100 Monitor for now-  stable.   # CKD stage III-likely DM/HNT/age related- Less likely related to myeloma-  stable.   # DISPOSITION: # refer to [facility] urology re: abnormal prostate MRI #  follow up 6 months - MD;  prior 2 weeks- labs [cbc, cmp, spep, ferritin, iron studies; MM panel; K/l light chains]-Dr.B

## 2025-06-14 ENCOUNTER — Inpatient Hospital Stay

## 2025-06-28 ENCOUNTER — Inpatient Hospital Stay: Admitting: Internal Medicine
